# Patient Record
Sex: Female | Born: 1940 | Hispanic: No | Marital: Married | Smoking: Never smoker
Health system: Southern US, Community
[De-identification: ages and names within clinical notes are randomized; demographics above are authoritative.]

## PROBLEM LIST (undated history)

## (undated) DIAGNOSIS — E7800 Pure hypercholesterolemia, unspecified: Secondary | ICD-10-CM

## (undated) DIAGNOSIS — E119 Type 2 diabetes mellitus without complications: Secondary | ICD-10-CM

## (undated) DIAGNOSIS — I10 Essential (primary) hypertension: Secondary | ICD-10-CM

## (undated) HISTORY — PX: CHOLECYSTECTOMY: SHX55

---

## 2018-12-23 ENCOUNTER — Encounter (HOSPITAL_BASED_OUTPATIENT_CLINIC_OR_DEPARTMENT_OTHER): Payer: Self-pay

## 2018-12-23 ENCOUNTER — Emergency Department (HOSPITAL_BASED_OUTPATIENT_CLINIC_OR_DEPARTMENT_OTHER)
Admission: EM | Admit: 2018-12-23 | Discharge: 2018-12-23 | Disposition: A | Discharge status: Home / Self Care | Payer: Medicare Other | Attending: Emergency Medicine | Admitting: Emergency Medicine

## 2018-12-23 ENCOUNTER — Emergency Department (HOSPITAL_BASED_OUTPATIENT_CLINIC_OR_DEPARTMENT_OTHER): Payer: Medicare Other

## 2018-12-23 ENCOUNTER — Other Ambulatory Visit: Payer: Self-pay

## 2018-12-23 DIAGNOSIS — J40 Bronchitis, not specified as acute or chronic: Secondary | ICD-10-CM | POA: Diagnosis not present

## 2018-12-23 DIAGNOSIS — R05 Cough: Secondary | ICD-10-CM | POA: Diagnosis present

## 2018-12-23 DIAGNOSIS — E119 Type 2 diabetes mellitus without complications: Secondary | ICD-10-CM | POA: Diagnosis not present

## 2018-12-23 DIAGNOSIS — I10 Essential (primary) hypertension: Secondary | ICD-10-CM | POA: Insufficient documentation

## 2018-12-23 DIAGNOSIS — G510 Bell's palsy: Secondary | ICD-10-CM | POA: Insufficient documentation

## 2018-12-23 HISTORY — DX: Essential (primary) hypertension: I10

## 2018-12-23 HISTORY — DX: Type 2 diabetes mellitus without complications: E11.9

## 2018-12-23 HISTORY — DX: Pure hypercholesterolemia, unspecified: E78.00

## 2018-12-23 LAB — CBC WITH DIFFERENTIAL/PLATELET
Abs Immature Granulocytes: 0.02 10*3/uL (ref 0.00–0.07)
Basophils Absolute: 0 10*3/uL (ref 0.0–0.1)
Basophils Relative: 0 %
Eosinophils Absolute: 0.2 10*3/uL (ref 0.0–0.5)
Eosinophils Relative: 2 %
HCT: 41.7 % (ref 36.0–46.0)
Hemoglobin: 13.6 g/dL (ref 12.0–15.0)
Immature Granulocytes: 0 %
Lymphocytes Relative: 33 %
Lymphs Abs: 2.5 10*3/uL (ref 0.7–4.0)
MCH: 29.7 pg (ref 26.0–34.0)
MCHC: 32.6 g/dL (ref 30.0–36.0)
MCV: 91 fL (ref 80.0–100.0)
Monocytes Absolute: 0.6 10*3/uL (ref 0.1–1.0)
Monocytes Relative: 7 %
Neutro Abs: 4.5 10*3/uL (ref 1.7–7.7)
Neutrophils Relative %: 58 %
Platelets: 239 10*3/uL (ref 150–400)
RBC: 4.58 MIL/uL (ref 3.87–5.11)
RDW: 12.8 % (ref 11.5–15.5)
WBC: 7.8 10*3/uL (ref 4.0–10.5)
nRBC: 0 % (ref 0.0–0.2)

## 2018-12-23 LAB — URINALYSIS, ROUTINE W REFLEX MICROSCOPIC
Bilirubin Urine: NEGATIVE
GLUCOSE, UA: 100 mg/dL — AB
HGB URINE DIPSTICK: NEGATIVE
Ketones, ur: NEGATIVE mg/dL
Leukocytes, UA: NEGATIVE
Nitrite: NEGATIVE
Protein, ur: NEGATIVE mg/dL
Specific Gravity, Urine: <1.005 — ABNORMAL LOW (ref 1.005–1.030)
pH: 6.5 (ref 5.0–8.0)

## 2018-12-23 LAB — COMPREHENSIVE METABOLIC PANEL
ALT: 35 U/L (ref 0–44)
AST: 25 U/L (ref 15–41)
Albumin: 4.4 g/dL (ref 3.5–5.0)
Alkaline Phosphatase: 65 U/L (ref 38–126)
Anion gap: 7 (ref 5–15)
BUN: 17 mg/dL (ref 8–23)
CO2: 28 mmol/L (ref 22–32)
Calcium: 9.3 mg/dL (ref 8.9–10.3)
Chloride: 103 mmol/L (ref 98–111)
Creatinine, Ser: 0.88 mg/dL (ref 0.44–1.00)
GFR calc non Af Amer: >60 mL/min (ref >60)
Glucose, Bld: 194 mg/dL — ABNORMAL HIGH (ref 70–99)
Potassium: 3.7 mmol/L (ref 3.5–5.1)
Sodium: 138 mmol/L (ref 135–145)
TOTAL PROTEIN: 7.3 g/dL (ref 6.5–8.1)
Total Bilirubin: 1.1 mg/dL (ref 0.3–1.2)

## 2018-12-23 LAB — TROPONIN I: Troponin I: <0.03 ng/mL (ref <0.03)

## 2018-12-23 MED ORDER — VALACYCLOVIR HCL 1 G PO TABS: 1000.0000 mg | ORAL_TABLET | Freq: Three times a day (TID) | ORAL | 0 refills | Status: DC

## 2018-12-23 MED ORDER — IPRATROPIUM-ALBUTEROL 0.5-2.5 (3) MG/3ML IN SOLN
3.0000 mL | Freq: Once | RESPIRATORY_TRACT | Status: AC
  Administered 2018-12-23: 3 mL via RESPIRATORY_TRACT
  Filled 2018-12-23: qty 3

## 2018-12-23 MED ORDER — HYPROMELLOSE (GONIOSCOPIC) 2.5 % OP SOLN: 1.0000 [drp] | Freq: Three times a day (TID) | OPHTHALMIC | 12 refills | Status: AC | PRN

## 2018-12-23 MED ORDER — PREDNISONE 50 MG PO TABS
60.0000 mg | ORAL_TABLET | Freq: Once | ORAL | Status: AC
  Administered 2018-12-23: 60 mg via ORAL
  Filled 2018-12-23: qty 1

## 2018-12-23 MED ORDER — SODIUM CHLORIDE 0.9 % IV BOLUS
1000.0000 mL | Freq: Once | INTRAVENOUS | Status: AC
  Administered 2018-12-23: 1000 mL via INTRAVENOUS

## 2018-12-23 MED ORDER — ALBUTEROL SULFATE HFA 108 (90 BASE) MCG/ACT IN AERS
1.0000 | INHALATION_SPRAY | RESPIRATORY_TRACT | Status: DC | PRN
  Administered 2018-12-23: 2 via RESPIRATORY_TRACT
  Filled 2018-12-23: qty 6.7

## 2018-12-23 MED ORDER — MORPHINE SULFATE (PF) 4 MG/ML IV SOLN
4.0000 mg | Freq: Once | INTRAVENOUS | Status: AC
  Administered 2018-12-23: 4 mg via INTRAVENOUS
  Filled 2018-12-23: qty 1

## 2018-12-23 MED ORDER — AEROCHAMBER PLUS FLO-VU MEDIUM MISC
1.0000 | Freq: Once | Status: DC
  Filled 2018-12-23: qty 1

## 2018-12-23 MED ORDER — PREDNISONE 20 MG PO TABS: 60.0000 mg | ORAL_TABLET | Freq: Every day | ORAL | 0 refills | Status: DC

## 2018-12-23 MED ORDER — ONDANSETRON HCL 4 MG/2ML IJ SOLN
4.0000 mg | Freq: Once | INTRAMUSCULAR | Status: AC
  Administered 2018-12-23: 4 mg via INTRAVENOUS
  Filled 2018-12-23: qty 2

## 2018-12-23 MED ORDER — DOXYCYCLINE HYCLATE 100 MG PO CAPS: 100.0000 mg | ORAL_CAPSULE | Freq: Two times a day (BID) | ORAL | 0 refills | Status: DC

## 2018-12-23 NOTE — ED Provider Notes (Signed)
[facility] EMERGENCY DEPARTMENT Provider Note   CSN: [id number] Arrival date & time: 12/23/18  1503     History   Chief Complaint Chief Complaint  Patient presents with  . Cough  . Headache    HPI Tabitha Esparza is a 78 y.o. female.  Pt presents to the ED today with cough and headache.  She speaks only [place] and son is interpreting.  She said sx have been going on for the past few days.  She has taken otc cough meds, but they have not helped.  She has not been around anyone who has been sick.  Pt also has weakness to her right face and pain in her right ear.  This weakness to the face has been going on for several days.  No weakness to her arms or legs.     Past Medical History:  Diagnosis Date  . Diabetes mellitus without complication (HCC)   . High cholesterol   . Hypertension     There are no active problems to display for this patient.   Past Surgical History:  Procedure Laterality Date  . CHOLECYSTECTOMY       OB History   No obstetric history on file.      Home Medications    Prior to Admission medications   Medication Sig Start Date End Date Taking? Authorizing Provider  doxycycline (VIBRAMYCIN) 100 MG capsule Take 1 capsule (100 mg total) by mouth 2 (two) times daily. 12/23/18   Jacalyn Lefevre, MD  hydroxypropyl methylcellulose / hypromellose (ISOPTO TEARS / GONIOVISC) 2.5 % ophthalmic solution Place 1 drop into the right eye 3 (three) times daily as needed for dry eyes. 12/23/18   Jacalyn Lefevre, MD  predniSONE (DELTASONE) 20 MG tablet Take 3 tablets (60 mg total) by mouth daily. 12/23/18   Jacalyn Lefevre, MD  valACYclovir (VALTREX) 1000 MG tablet Take 1 tablet (1,000 mg total) by mouth 3 (three) times daily. 12/23/18   Jacalyn Lefevre, MD    Family History No family history on file.  Social History Social History   Tobacco Use  . Smoking status: Never Smoker  . Smokeless tobacco: Never Used  Substance Use Topics  . Alcohol use: Never    Frequency: Never  . Drug use: Never     Allergies   Carrot [daucus carota]   Review of Systems Review of Systems  Respiratory: Positive for cough.   Neurological: Positive for headaches.  All other systems reviewed and are negative.    Physical Exam Updated Vital Signs BP (!) 147/91 (BP Location: Right Arm)   Pulse 87   Temp 98 F (36.7 C) (Oral)   Resp 18   Ht 5\' 2"  (1.575 m)   Wt 58.1 kg   SpO2 99%   BMI 23.41 kg/m   Physical Exam Vitals signs and nursing note reviewed.  Constitutional:      Appearance: She is well-developed and normal weight.  HENT:     Head: Normocephalic and atraumatic.     Comments: Eyelid unable to completely shut on right.    Mouth/Throat:     Mouth: Mucous membranes are moist.     Pharynx: Oropharynx is clear.  Eyes:     Extraocular Movements: Extraocular movements intact.     Pupils: Pupils are equal, round, and reactive to light.  Neck:     Musculoskeletal: Normal range of motion and neck supple.  Cardiovascular:     Rate and Rhythm: Normal rate and regular rhythm.  Pulmonary:  Effort: Pulmonary effort is normal.     Breath sounds: Normal breath sounds.  Abdominal:     General: Bowel sounds are normal.     Palpations: Abdomen is soft.  Musculoskeletal: Normal range of motion.  Skin:    General: Skin is warm.     Capillary Refill: Capillary refill takes less [place] 2 seconds.  Neurological:     Mental Status: She is alert.     Cranial Nerves: Facial asymmetry present.  Psychiatric:        Mood and Affect: Mood normal.        Behavior: Behavior normal.      ED Treatments / Results  Labs (all labs ordered are listed, but only abnormal results are displayed) Labs Reviewed  COMPREHENSIVE METABOLIC PANEL - Abnormal; Notable for the following components:      Result Value   Glucose, Bld 194 (*)    All other components within normal limits  URINALYSIS, ROUTINE W REFLEX MICROSCOPIC - Abnormal; Notable for the following  components:   Specific Gravity, Urine <1.005 (*)    Glucose, UA 100 (*)    All other components within normal limits  CBC WITH DIFFERENTIAL/PLATELET  TROPONIN I    EKG None  Radiology Dg Chest 2 View  Result Date: 12/23/2018 CLINICAL DATA:  Cough and shortness of breath for the past 3 days. EXAM: CHEST - 2 VIEW COMPARISON:  None. FINDINGS: The heart size and mediastinal contours are within normal limits. Normal pulmonary vascularity. No focal consolidation, pleural effusion, or pneumothorax. No acute osseous abnormality. Prior cholecystectomy. IMPRESSION: No active cardiopulmonary disease. Electronically Signed   By: Obie DredgeWilliam T Derry M.D.   On: 12/23/2018 16:22   Ct Head Wo Contrast  Result Date: 12/23/2018 CLINICAL DATA:  Cough, headache and right ear pain. Right facial swelling for 3 days. EXAM: CT HEAD WITHOUT CONTRAST TECHNIQUE: Contiguous axial images were obtained from the base of the skull through the vertex without intravenous contrast. COMPARISON:  None. FINDINGS: Brain: No evidence of acute infarction, hemorrhage, hydrocephalus, extra-axial collection or mass lesion/mass effect. Prominence of the sulci and ventricles identified compatible with age related brain atrophy. 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 There is mild diffuse low-attenuation within the subcortical and periventricular white matter compatible with chronic microvascular disease. Vascular: No hyperdense vessel or unexpected calcification. Skull: Normal. Negative for fracture or focal lesion. Sinuses/Orbits: The mastoid air cells and paranasal sinuses are clear. Other: None. IMPRESSION: 1. No acute intracranial abnormalities. 2. Chronic small vessel ischemic change and brain atrophy. Electronically Signed   By: Signa Kellaylor  Stroud M.D.   On: 12/23/2018 16:30    Procedures Procedures (including critical care time)  Medications Ordered in ED Medications  albuterol (PROVENTIL HFA;VENTOLIN HFA)  108 (90 Base) MCG/ACT inhaler 1-2 puff (has no administration in time range)  AEROCHAMBER PLUS FLO-VU MEDIUM MISC 1 each (has no administration in time range)  predniSONE (DELTASONE) tablet 60 mg (has no administration in time range)  ipratropium-albuterol (DUONEB) 0.5-2.5 (3) MG/3ML nebulizer solution 3 mL (3 mLs Nebulization Given 12/23/18 1628)  sodium chloride 0.9 % bolus 1,000 mL (1,000 mLs Intravenous New Bag/Given 12/23/18 1637)  ondansetron (ZOFRAN) injection 4 mg (4 mg Intravenous Given 12/23/18 1637)  morphine 4 MG/ML injection 4 mg (4 mg Intravenous Given 12/23/18 1638)     Initial Impression / Assessment and Plan / ED Course  I have reviewed the triage vital signs and the nursing notes.  Pertinent labs & imaging results that were available during  my care of the patient were reviewed by me and considered in my medical decision making (see chart for details).    Pt looks like she has Bells palsy on the right.  She also has bronchitis, so will be treated for both.  She does not have an obvious lesion in her ear, but does have ear pain and will be treated with valtrex.  She knows to return if worse.  F/u with pcp.  Final Clinical Impressions(s) / ED Diagnoses   Final diagnoses:  Bell's palsy  Bronchitis    ED Discharge Orders         Ordered    valACYclovir (VALTREX) 1000 MG tablet  3 times daily     12/23/18 1754    predniSONE (DELTASONE) 20 MG tablet  Daily     12/23/18 1754    doxycycline (VIBRAMYCIN) 100 MG capsule  2 times daily     12/23/18 1754    hydroxypropyl methylcellulose / hypromellose (ISOPTO TEARS / GONIOVISC) 2.5 % ophthalmic solution  3 times daily PRN     12/23/18 1754           Jacalyn Lefevre, MD 12/23/18 1756

## 2018-12-23 NOTE — ED Notes (Signed)
C/o productive cough (clear), ha, rt ear pain, rt facial swelling x 3 days

## 2018-12-23 NOTE — ED Triage Notes (Addendum)
C/o prod cough, HA x 3 days-NAD-to triage in w/c-pt's son inerpreting

## 2020-06-08 ENCOUNTER — Emergency Department (HOSPITAL_BASED_OUTPATIENT_CLINIC_OR_DEPARTMENT_OTHER)
Admission: EM | Admit: 2020-06-08 | Discharge: 2020-06-08 | Disposition: A | Discharge status: Home / Self Care | Payer: Medicare Other | Attending: Emergency Medicine | Admitting: Emergency Medicine

## 2020-06-08 ENCOUNTER — Encounter (HOSPITAL_BASED_OUTPATIENT_CLINIC_OR_DEPARTMENT_OTHER): Payer: Self-pay | Admitting: Emergency Medicine

## 2020-06-08 ENCOUNTER — Emergency Department (HOSPITAL_BASED_OUTPATIENT_CLINIC_OR_DEPARTMENT_OTHER): Payer: Medicare Other

## 2020-06-08 ENCOUNTER — Other Ambulatory Visit: Payer: Self-pay

## 2020-06-08 DIAGNOSIS — E119 Type 2 diabetes mellitus without complications: Secondary | ICD-10-CM | POA: Diagnosis not present

## 2020-06-08 DIAGNOSIS — R519 Headache, unspecified: Secondary | ICD-10-CM | POA: Diagnosis present

## 2020-06-08 DIAGNOSIS — Z7984 Long term (current) use of oral hypoglycemic drugs: Secondary | ICD-10-CM | POA: Insufficient documentation

## 2020-06-08 DIAGNOSIS — R0602 Shortness of breath: Secondary | ICD-10-CM | POA: Diagnosis not present

## 2020-06-08 DIAGNOSIS — G44209 Tension-type headache, unspecified, not intractable: Secondary | ICD-10-CM | POA: Diagnosis not present

## 2020-06-08 LAB — CBC WITH DIFFERENTIAL/PLATELET
Abs Immature Granulocytes: 0.02 10*3/uL (ref 0.00–0.07)
Basophils Absolute: 0.1 10*3/uL (ref 0.0–0.1)
Basophils Relative: 1 %
Eosinophils Absolute: 0.3 10*3/uL (ref 0.0–0.5)
Eosinophils Relative: 4 %
HCT: 40.1 % (ref 36.0–46.0)
Hemoglobin: 13.4 g/dL (ref 12.0–15.0)
Immature Granulocytes: 0 %
Lymphocytes Relative: 31 %
Lymphs Abs: 2.5 10*3/uL (ref 0.7–4.0)
MCH: 29.8 pg (ref 26.0–34.0)
MCHC: 33.4 g/dL (ref 30.0–36.0)
MCV: 89.1 fL (ref 80.0–100.0)
Monocytes Absolute: 0.5 10*3/uL (ref 0.1–1.0)
Monocytes Relative: 6 %
Neutro Abs: 4.6 10*3/uL (ref 1.7–7.7)
Neutrophils Relative %: 58 %
Platelets: 245 10*3/uL (ref 150–400)
RBC: 4.5 MIL/uL (ref 3.87–5.11)
RDW: 13.1 % (ref 11.5–15.5)
WBC: 8 10*3/uL (ref 4.0–10.5)
nRBC: 0 % (ref 0.0–0.2)

## 2020-06-08 LAB — COMPREHENSIVE METABOLIC PANEL
ALT: 35 U/L (ref 0–44)
AST: 25 U/L (ref 15–41)
Albumin: 4.1 g/dL (ref 3.5–5.0)
Alkaline Phosphatase: 67 U/L (ref 38–126)
Anion gap: 8 (ref 5–15)
BUN: 19 mg/dL (ref 8–23)
CO2: 27 mmol/L (ref 22–32)
Calcium: 9.3 mg/dL (ref 8.9–10.3)
Chloride: 100 mmol/L (ref 98–111)
Creatinine, Ser: 1.06 mg/dL — ABNORMAL HIGH (ref 0.44–1.00)
GFR calc Af Amer: 59 mL/min — ABNORMAL LOW (ref >60)
GFR calc non Af Amer: 51 mL/min — ABNORMAL LOW (ref >60)
Glucose, Bld: 108 mg/dL — ABNORMAL HIGH (ref 70–99)
Potassium: 4.6 mmol/L (ref 3.5–5.1)
Sodium: 135 mmol/L (ref 135–145)
Total Bilirubin: 0.9 mg/dL (ref 0.3–1.2)
Total Protein: 7.4 g/dL (ref 6.5–8.1)

## 2020-06-08 LAB — TROPONIN I (HIGH SENSITIVITY): Troponin I (High Sensitivity): 3 ng/L (ref <18)

## 2020-06-08 NOTE — ED Notes (Signed)
Spoke with pt's son, Odette Horns, and he will pick her up.

## 2020-06-08 NOTE — Discharge Instructions (Signed)
Please schedule follow-up appointment with primary doctor regarding the episode that you are having today.  If you have worsening headaches, neck pain, chest pain, difficulty breathing or other new concerning symptom, recommend return to ER for reassessment.

## 2020-06-08 NOTE — ED Triage Notes (Signed)
Per daughter, pt c/p HA and feeling weak since last night; pt c/o SHOB, no distress noted in triage

## 2020-06-08 NOTE — ED Provider Notes (Signed)
[facility] EMERGENCY DEPARTMENT Provider Note   CSN: [id number] Arrival date & time: 06/08/20  1012     History Chief Complaint  Patient presents with   Headache   Weakness    Tabitha Esparza is a 79 y.o. female.  Accompanied by daughter, patient is via me speaking only.  Additional history obtained via daughter who speaks [place].  Utilized Electronics engineer with patient.  Patient presented to ER with complaints of generalized weakness, headache and shortness of breath.  Symptoms started sometime last night.  Had an episode where she felt like she could not catch her breath.  The breathing issues seem to resolve spontaneously she does not have any ongoing shortness of breath.  There was no associated chest pain.  Also is having dull frontal headache, mild, not worse headache of her life, no associated neck pain, neck stiffness, no fevers.  PMH DM, HL, HTN, nonsmoker.   HPI     Past Medical History:  Diagnosis Date   Diabetes mellitus without complication (HCC)    High cholesterol    Hypertension     There are no problems to display for this patient.   Past Surgical History:  Procedure Laterality Date   CHOLECYSTECTOMY       OB History   No obstetric history on file.     No family history on file.  Social History   Tobacco Use   Smoking status: Never Smoker   Smokeless tobacco: Never Used  Substance Use Topics   Alcohol use: Never   Drug use: Never    Home Medications Prior to Admission medications   Medication Sig Start Date End Date Taking? Authorizing Provider  doxycycline (VIBRAMYCIN) 100 MG capsule Take 1 capsule (100 mg total) by mouth 2 (two) times daily. 12/23/18   Jacalyn Lefevre, MD  hydroxypropyl methylcellulose / hypromellose (ISOPTO TEARS / GONIOVISC) 2.5 % ophthalmic solution Place 1 drop into the right eye 3 (three) times daily as needed for dry eyes. 12/23/18   Jacalyn Lefevre, MD  predniSONE (DELTASONE) 20 MG tablet  Take 3 tablets (60 mg total) by mouth daily. 12/23/18   Jacalyn Lefevre, MD  valACYclovir (VALTREX) 1000 MG tablet Take 1 tablet (1,000 mg total) by mouth 3 (three) times daily. 12/23/18   Jacalyn Lefevre, MD    Allergies    Carrot [daucus carota]  Review of Systems   Review of Systems  Constitutional: Negative for chills and fever.  HENT: Negative for ear pain and sore throat.   Eyes: Negative for pain and visual disturbance.  Respiratory: Positive for shortness of breath. Negative for cough.   Cardiovascular: Positive for palpitations. Negative for chest pain.  Gastrointestinal: Negative for abdominal pain and vomiting.  Genitourinary: Negative for dysuria and hematuria.  Musculoskeletal: Negative for arthralgias and back pain.  Skin: Negative for color change and rash.  Neurological: Positive for headaches. Negative for seizures and syncope.  All other systems reviewed and are negative.   Physical Exam Updated Vital Signs BP (!) 141/84    Pulse 70    Temp 97.6 F (36.4 C) (Oral)    Resp 16    Ht 5\' 4"  (1.626 m)    Wt 59.6 kg    SpO2 99%    BMI 22.57 kg/m   Physical Exam  ED Results / Procedures / Treatments   Labs (all labs ordered are listed, but only abnormal results are displayed) Labs Reviewed  COMPREHENSIVE METABOLIC PANEL - Abnormal; Notable for the following components:  Result Value   Glucose, Bld 108 (*)    Creatinine, Ser 1.06 (*)    GFR calc non Af Amer 51 (*)    GFR calc Af Amer 59 (*)    All other components within normal limits  CBC WITH DIFFERENTIAL/PLATELET  TROPONIN I (HIGH SENSITIVITY)    EKG EKG Interpretation  Date/Time:  Tuesday June 08 2020 14:07:13 EDT Ventricular Rate:  66 PR Interval:    QRS Duration: 86 QT Interval:  427 QTC Calculation: 448 R Axis:   71 Text Interpretation: Normal sinus rhythm LOW VOLTAGE Non-specific ST-t changes No old tracing to compare Confirmed by Virgel Manifold [phone]) on 06/08/2020 3:32:13  PM   Radiology DG Chest 2 View  Result Date: 06/08/2020 CLINICAL DATA:  Chest pain.  Shortness of breath. EXAM: CHEST - 2 VIEW COMPARISON:  12/23/2018. FINDINGS: Mediastinum and hilar structures normal. Lungs are clear. No pleural effusion or pneumothorax. Heart size normal. Thoracolumbar spine scoliosis. Surgical clips right upper quadrant. IMPRESSION: No acute cardiopulmonary disease. Electronically Signed   By: Marcello Moores  Register   On: 06/08/2020 14:13   CT Head Wo Contrast  Result Date: 06/08/2020 CLINICAL DATA:  Headache, acute, normal neuro exam. Additional history provided: Headache, weakness, fatigue since yesterday. EXAM: CT HEAD WITHOUT CONTRAST TECHNIQUE: Contiguous axial images were obtained from the base of the skull through the vertex without intravenous contrast. COMPARISON:  Prior head CT examinations 12/23/2018 and earlier, brain MRI 05/11/2017 FINDINGS: Brain: Stable, mild generalized parenchymal atrophy. Mild ill-defined hypoattenuation within the cerebral white matter is nonspecific, but consistent with chronic small vessel ischemic disease. These findings have also not significantly changed as compared to the prior CT of 12/23/2018. There is no acute intracranial hemorrhage. No demarcated cortical infarct is identified. No extra-axial fluid collection. No evidence of intracranial mass. No midline shift. Partially empty sella turcica. Vascular: No hyperdense vessel.  Atherosclerotic calcifications. Skull: Normal. Negative for fracture or focal lesion. Sinuses/Orbits: Visualized orbits show no acute finding. No significant paranasal sinus disease or mastoid effusion at the imaged levels. Chronic medially displaced fracture deformity of the left lamina papyracea. IMPRESSION: No CT evidence of acute intracranial abnormality. Stable, mild generalized parenchymal atrophy and chronic small vessel ischemic disease. Electronically Signed   By: Kellie Simmering DO   On: 06/08/2020 14:31     Procedures Procedures (including critical care time)  Medications Ordered in ED Medications - No data to display  ED Course  I have reviewed the triage vital signs and the nursing notes.  Pertinent labs & imaging results that were available during my care of the patient were reviewed by me and considered in my medical decision making (see chart for details).    MDM Rules/Calculators/A&P                          79 year old lady presented to ER with headache, episode of shortness of breath and generalized fatigue.  On exam, patient is well-appearing, her symptoms have mostly resolved spontaneously.  EKG without acute ischemic change, troponin within normal limits, doubt ACS.  CXR negative.  No hypoxia, no tachypnea or tachycardia, doubt PE.  Regarding headache, CT head was negative for acute pathology, she has no neurologic complaints and is well-appearing.  Doubt acute intracranial pathology given this assessment work-up.  On reassessment, patient remains well-appearing, she has no ongoing symptoms.  Given the work-up that was completed today, believe patient can be discharged home and managed in the outpatient setting.  I  recommended that she follow-up with her primary doctor, recommend return to ER for recurrent or worsening symptoms.  Daughter at bedside updated throughout patient's stay and in agreement with plan.    After the discussed management above, the patient was determined to be safe for discharge.  The patient was in agreement with this plan and all questions regarding their care were answered.  ED return precautions were discussed and the patient will return to the ED with any significant worsening of condition.   Final Clinical Impression(s) / ED Diagnoses Final diagnoses:  Shortness of breath  Tension-type headache, not intractable, unspecified chronicity pattern    Rx / DC Orders ED Discharge Orders    None       Milagros Loll, MD 06/09/20 1047

## 2020-11-09 IMAGING — CT CT HEAD W/O CM
3 series · 15 of 47 positions shown, 18 images · non-contrast
Comparison: Prior head CT examinations 12/23/2018 and earlier,
brain MRI 05/11/2017

CLINICAL DATA: Headache, acute, normal neuro exam. Additional
history provided: Headache, weakness, fatigue since yesterday.

EXAM:
CT HEAD WITHOUT CONTRAST
TECHNIQUE: Contiguous axial images were obtained from the base of the skull
through the vertex without intravenous contrast.

[Series 2: head wo · axial · 0.41mm/px · z∈[-168,-38]mm · 9 of 32 slices shown, 12 images]
[im 3/32  brain]
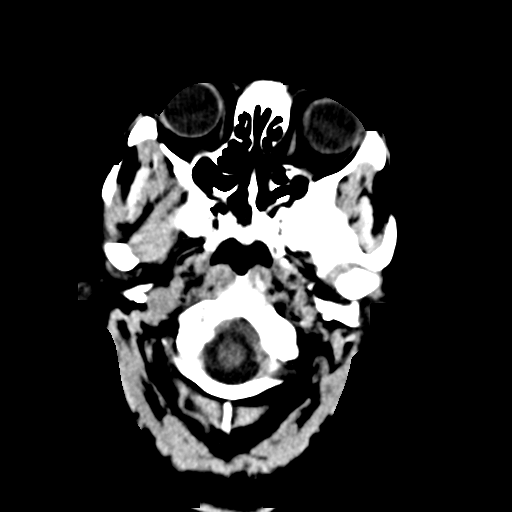
[im 3/32  bone]
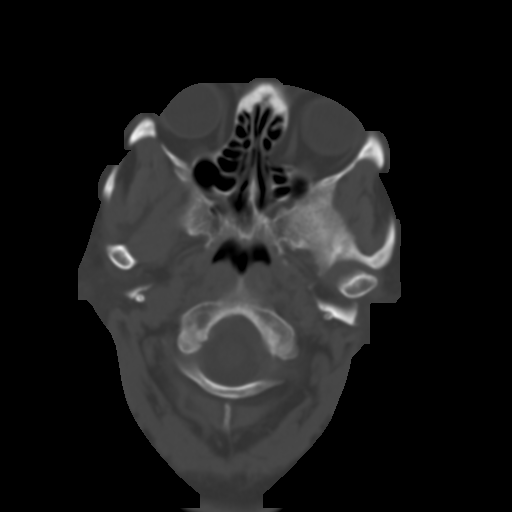
[im 6/32  brain]
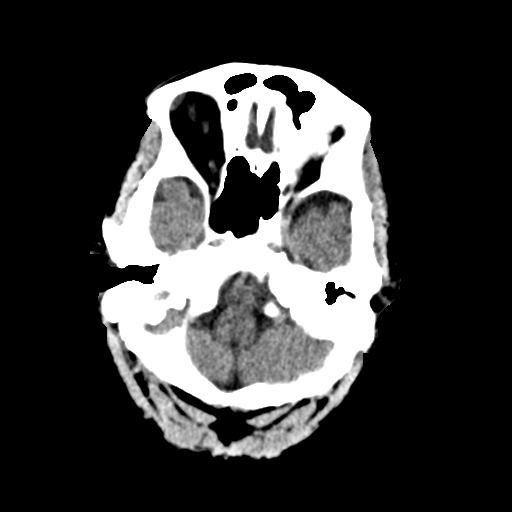
[im 9/32  brain]
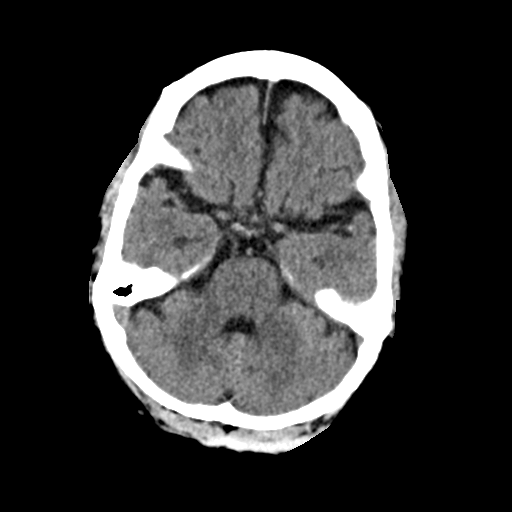
[im 12/32  brain]
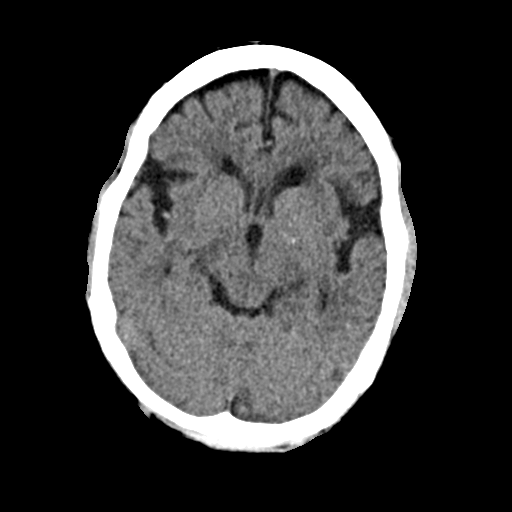
[im 17/32  brain]
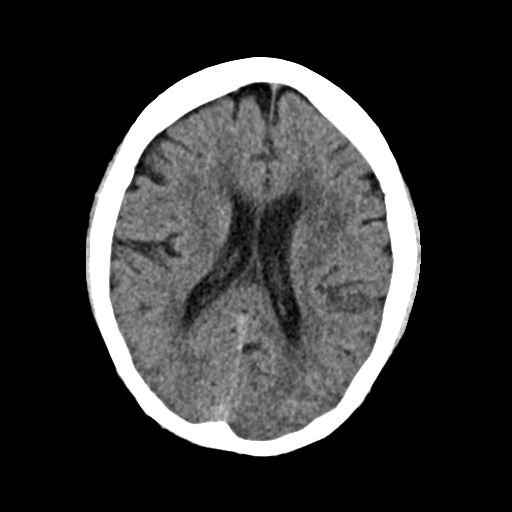
[im 17/32  bone]
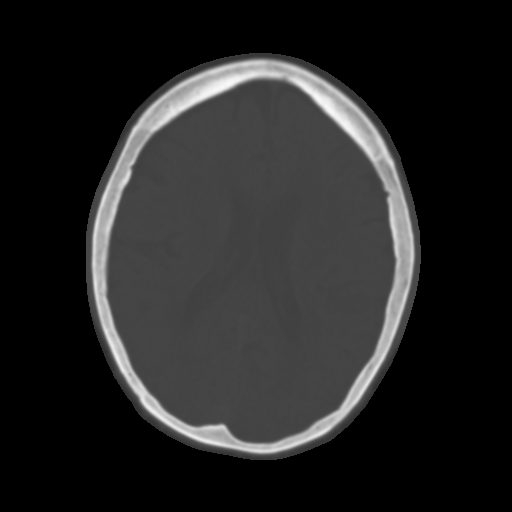
[im 20/32  brain]
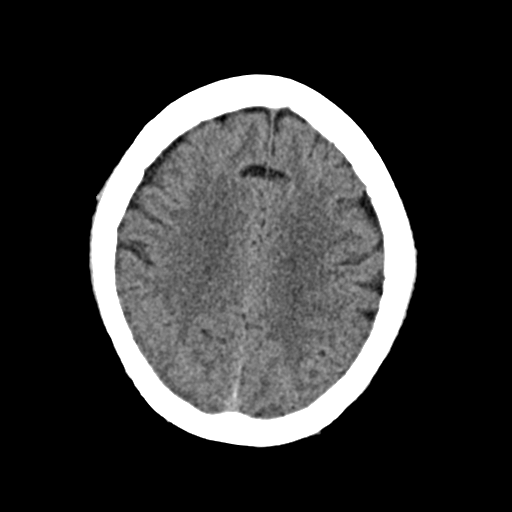
[im 23/32  brain]
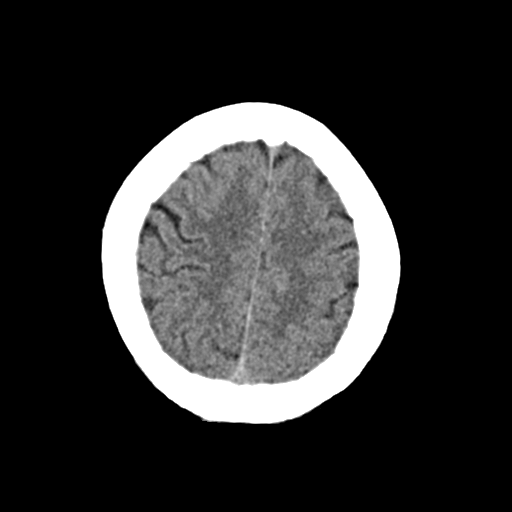
[im 26/32  brain]
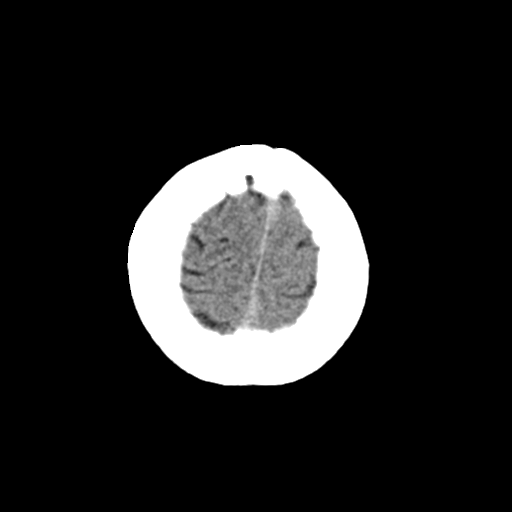
[im 29/32  brain]
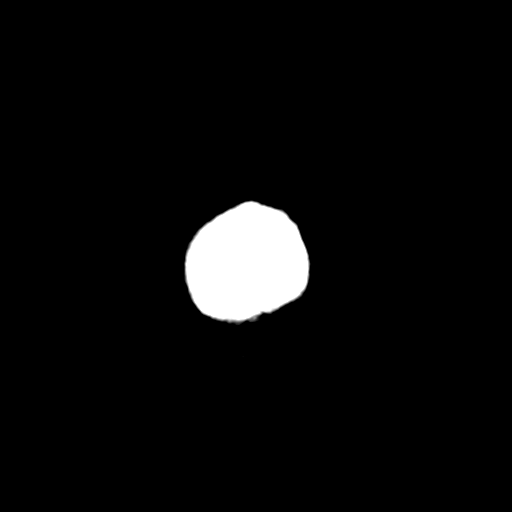
[im 29/32  bone]
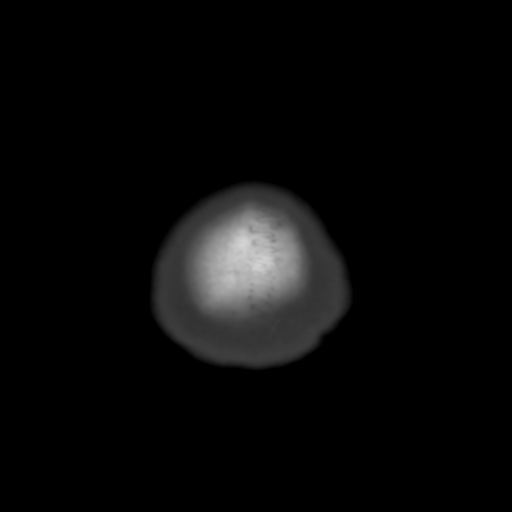

[Series 4: coronal soft · coronal · 0.33mm/px · 3 of 64 slices shown]
[im 22/64  brain]
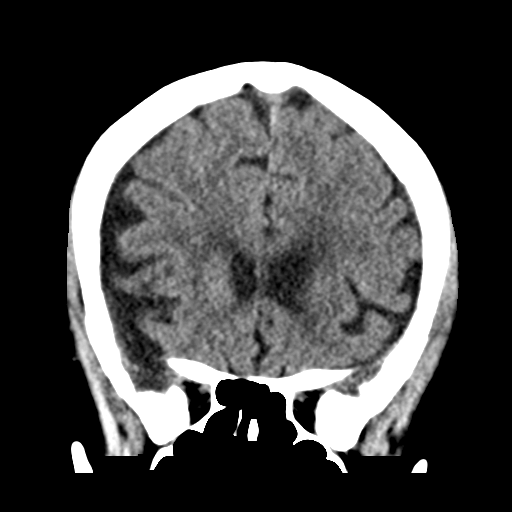
[im 29/64  brain]
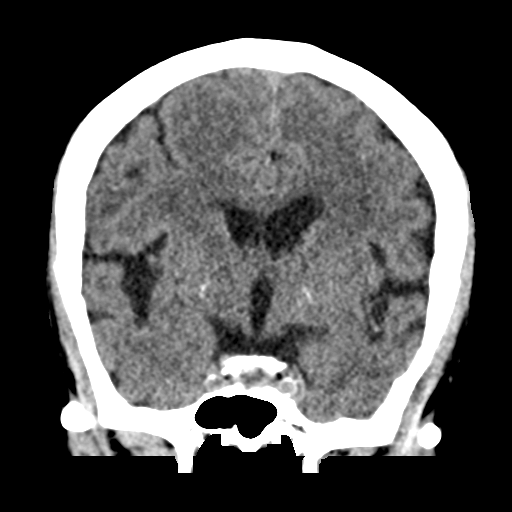
[im 36/64  brain]
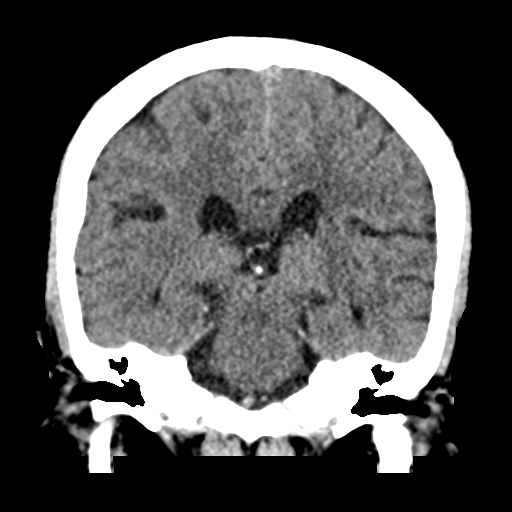

[Series 5: sag soft · sagittal · 0.30mm/px · 3 of 53 slices shown]
[im 18/53  brain]
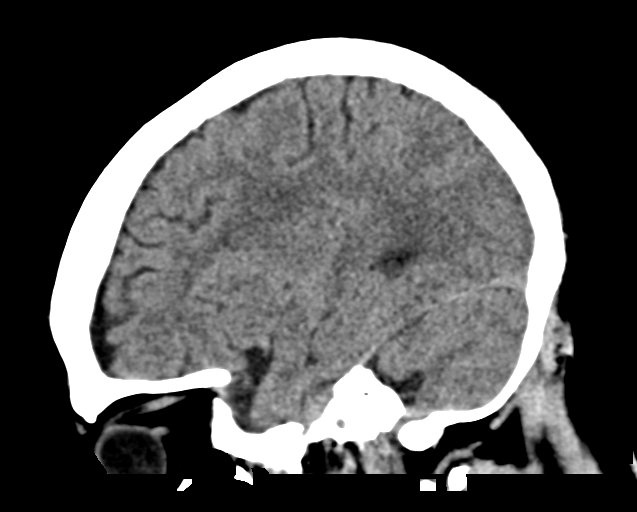
[im 27/53  brain]
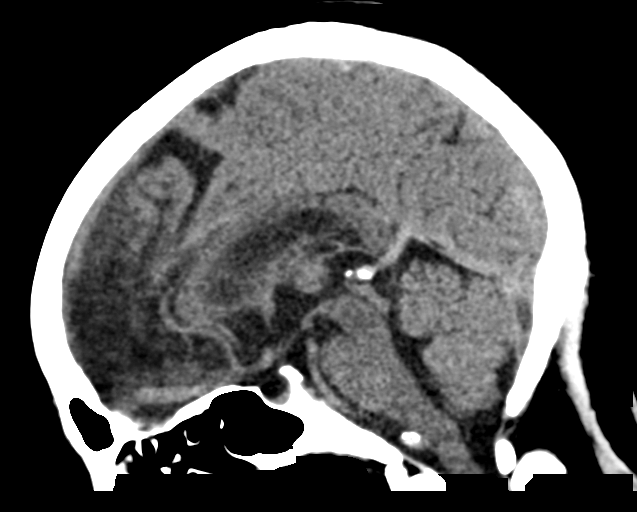
[im 35/53  brain]
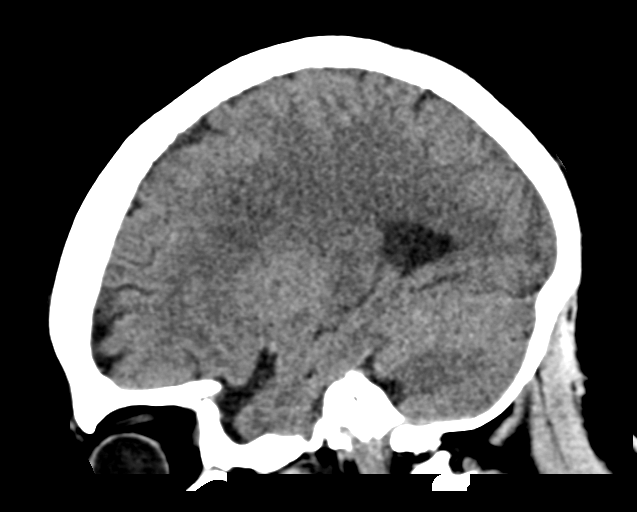

[15 of 47 positions shown; findings below may reference images not displayed]

FINDINGS: Brain:

Stable, mild generalized parenchymal atrophy.

Mild ill-defined hypoattenuation within the cerebral white matter is
nonspecific, but consistent with chronic small vessel ischemic
disease. These findings have also not significantly changed as
compared to the prior CT of 12/23/2018.

There is no acute intracranial hemorrhage.

No demarcated cortical infarct is identified.

No extra-axial fluid collection.

No evidence of intracranial mass.

No midline shift.

Partially empty sella turcica.

Vascular: No hyperdense vessel.  Atherosclerotic calcifications.

Skull: Normal. Negative for fracture or focal lesion.

Sinuses/Orbits: Visualized orbits show no acute finding. No
significant paranasal sinus disease or mastoid effusion at the
imaged levels. Chronic medially displaced fracture deformity of the
left lamina papyracea.
IMPRESSION: No CT evidence of acute intracranial abnormality.

Stable, mild generalized parenchymal atrophy and chronic small
vessel ischemic disease.

## 2022-01-24 ENCOUNTER — Other Ambulatory Visit: Payer: Self-pay

## 2022-01-24 ENCOUNTER — Encounter (HOSPITAL_BASED_OUTPATIENT_CLINIC_OR_DEPARTMENT_OTHER): Payer: Self-pay | Admitting: Urology

## 2022-01-24 ENCOUNTER — Emergency Department (HOSPITAL_BASED_OUTPATIENT_CLINIC_OR_DEPARTMENT_OTHER)
Admission: EM | Admit: 2022-01-24 | Discharge: 2022-01-24 | Disposition: A | Discharge status: Home / Self Care | Payer: Medicare Other | Attending: Emergency Medicine | Admitting: Emergency Medicine

## 2022-01-24 ENCOUNTER — Emergency Department (HOSPITAL_BASED_OUTPATIENT_CLINIC_OR_DEPARTMENT_OTHER): Payer: Medicare Other

## 2022-01-24 DIAGNOSIS — E1165 Type 2 diabetes mellitus with hyperglycemia: Secondary | ICD-10-CM | POA: Insufficient documentation

## 2022-01-24 DIAGNOSIS — E139 Other specified diabetes mellitus without complications: Secondary | ICD-10-CM

## 2022-01-24 DIAGNOSIS — Z79899 Other long term (current) drug therapy: Secondary | ICD-10-CM | POA: Diagnosis not present

## 2022-01-24 DIAGNOSIS — R739 Hyperglycemia, unspecified: Secondary | ICD-10-CM

## 2022-01-24 DIAGNOSIS — Z20822 Contact with and (suspected) exposure to covid-19: Secondary | ICD-10-CM | POA: Insufficient documentation

## 2022-01-24 DIAGNOSIS — J069 Acute upper respiratory infection, unspecified: Secondary | ICD-10-CM | POA: Diagnosis not present

## 2022-01-24 DIAGNOSIS — R059 Cough, unspecified: Secondary | ICD-10-CM | POA: Diagnosis present

## 2022-01-24 LAB — CBC WITH DIFFERENTIAL/PLATELET
Abs Immature Granulocytes: 0.02 10*3/uL (ref 0.00–0.07)
Basophils Absolute: 0 10*3/uL (ref 0.0–0.1)
Basophils Relative: 0 %
Eosinophils Absolute: 0.2 10*3/uL (ref 0.0–0.5)
Eosinophils Relative: 2 %
HCT: 41.3 % (ref 36.0–46.0)
Hemoglobin: 14.3 g/dL (ref 12.0–15.0)
Immature Granulocytes: 0 %
Lymphocytes Relative: 31 %
Lymphs Abs: 2.3 10*3/uL (ref 0.7–4.0)
MCH: 30.7 pg (ref 26.0–34.0)
MCHC: 34.6 g/dL (ref 30.0–36.0)
MCV: 88.6 fL (ref 80.0–100.0)
Monocytes Absolute: 0.6 10*3/uL (ref 0.1–1.0)
Monocytes Relative: 8 %
Neutro Abs: 4.3 10*3/uL (ref 1.7–7.7)
Neutrophils Relative %: 59 %
Platelets: 241 10*3/uL (ref 150–400)
RBC: 4.66 MIL/uL (ref 3.87–5.11)
RDW: 12.6 % (ref 11.5–15.5)
WBC: 7.5 10*3/uL (ref 4.0–10.5)
nRBC: 0 % (ref 0.0–0.2)

## 2022-01-24 LAB — BASIC METABOLIC PANEL WITH GFR
Anion gap: 9 (ref 5–15)
BUN: 28 mg/dL — ABNORMAL HIGH (ref 8–23)
CO2: 24 mmol/L (ref 22–32)
Calcium: 9.1 mg/dL (ref 8.9–10.3)
Chloride: 103 mmol/L (ref 98–111)
Creatinine, Ser: 1.12 mg/dL — ABNORMAL HIGH (ref 0.44–1.00)
GFR, Estimated: 50 mL/min — ABNORMAL LOW
Glucose, Bld: 201 mg/dL — ABNORMAL HIGH (ref 70–99)
Potassium: 4 mmol/L (ref 3.5–5.1)
Sodium: 136 mmol/L (ref 135–145)

## 2022-01-24 LAB — RESP PANEL BY RT-PCR (FLU A&B, COVID) ARPGX2
Influenza A by PCR: NEGATIVE
Influenza B by PCR: NEGATIVE
SARS Coronavirus 2 by RT PCR: NEGATIVE

## 2022-01-24 LAB — CBG MONITORING, ED: Glucose-Capillary: 198 mg/dL — ABNORMAL HIGH (ref 70–99)

## 2022-01-24 LAB — TROPONIN I (HIGH SENSITIVITY): Troponin I (High Sensitivity): 3 ng/L (ref <18)

## 2022-01-24 MED ORDER — GUAIFENESIN-DM 100-10 MG/5ML PO SYRP
10.0000 mL | ORAL_SOLUTION | ORAL | 0 refills | Status: DC | PRN
Start: 2022-01-24 — End: 2022-05-20

## 2022-01-24 MED ORDER — OXYMETAZOLINE HCL 0.05 % NA SOLN: 1.0000 | Freq: Two times a day (BID) | NASAL | 0 refills | Status: AC

## 2022-01-24 NOTE — Discharge Instructions (Addendum)
Drink warm tea with honey for sore throat Gargle with warm salt water three times a day  Take Robitussin cough syrup as needed for cough  Afrin nose spray for nasal congestion  Motrin/tylenol every 6 hours as needed for body aches or fevers

## 2022-01-24 NOTE — ED Provider Notes (Signed)
[facility] EMERGENCY DEPARTMENT Provider Note   CSN: [id number] Arrival date & time: 01/24/22  0855     History  Chief Complaint  Patient presents with   Nasal Congestion   Hyperglycemia    Tabitha Esparza is a 81 y.o. female.  Pt is a 81 yo female with PMH of diabetes controlled on oral meds only presenting for hyperglycemia. Pt admits to elevated sugars with highest at 330 at home. Admits to cold like symptoms over the past five days including nasal congestions, sore throat, and cough. Pt admits to severe weakness making it difficult to ambulate.   The history is provided by the patient. No language interpreter was used.  Hyperglycemia Associated symptoms: weakness   Associated symptoms: no abdominal pain, no chest pain, no dysuria, no fever, no shortness of breath and no vomiting       Home Medications Prior to Admission medications   Medication Sig Start Date End Date Taking? Authorizing Provider  doxycycline (VIBRAMYCIN) 100 MG capsule Take 1 capsule (100 mg total) by mouth 2 (two) times daily. 12/23/18   Isla Pence, MD  hydroxypropyl methylcellulose / hypromellose (ISOPTO TEARS / GONIOVISC) 2.5 % ophthalmic solution Place 1 drop into the right eye 3 (three) times daily as needed for dry eyes. 12/23/18   Isla Pence, MD  predniSONE (DELTASONE) 20 MG tablet Take 3 tablets (60 mg total) by mouth daily. 12/23/18   Isla Pence, MD  valACYclovir (VALTREX) 1000 MG tablet Take 1 tablet (1,000 mg total) by mouth 3 (three) times daily. 12/23/18   Isla Pence, MD      Allergies    Carrot [daucus carota]    Review of Systems   Review of Systems  Constitutional:  Negative for chills and fever.  HENT:  Positive for congestion and sore throat. Negative for ear pain.   Eyes:  Negative for pain and visual disturbance.  Respiratory:  Negative for cough and shortness of breath.   Cardiovascular:  Negative for chest pain and palpitations.  Gastrointestinal:  Negative for  abdominal pain and vomiting.  Genitourinary:  Negative for dysuria and hematuria.  Musculoskeletal:  Negative for arthralgias and back pain.  Skin:  Negative for color change and rash.  Neurological:  Positive for weakness. Negative for seizures and syncope.  All other systems reviewed and are negative.  Physical Exam Updated Vital Signs BP 134/85 (BP Location: Right Arm)    Pulse 75    Temp 98.2 F (36.8 C) (Oral)    Resp 18    Ht 5\' 4"  (1.626 m)    Wt 59.6 kg    SpO2 98%    BMI 22.55 kg/m  Physical Exam Vitals and nursing note reviewed.  Constitutional:      General: She is not in acute distress.    Appearance: She is well-developed.  HENT:     Head: Normocephalic and atraumatic.  Eyes:     Conjunctiva/sclera: Conjunctivae normal.  Cardiovascular:     Rate and Rhythm: Normal rate and regular rhythm.     Heart sounds: No murmur heard. Pulmonary:     Effort: Pulmonary effort is normal. No respiratory distress.     Breath sounds: Normal breath sounds.  Abdominal:     Palpations: Abdomen is soft.     Tenderness: There is no abdominal tenderness.  Musculoskeletal:        General: No swelling.     Cervical back: Neck supple.  Skin:    General: Skin is warm and dry.  Capillary Refill: Capillary refill takes less [place] 2 seconds.  Neurological:     Mental Status: She is alert.  Psychiatric:        Mood and Affect: Mood normal.    ED Results / Procedures / Treatments   Labs (all labs ordered are listed, but only abnormal results are displayed) Labs Reviewed  CBG MONITORING, ED - Abnormal; Notable for the following components:      Result Value   Glucose-Capillary 198 (*)    All other components within normal limits  RESP PANEL BY RT-PCR (FLU A&B, COVID) ARPGX2  CBC WITH DIFFERENTIAL/PLATELET  BASIC METABOLIC PANEL  TROPONIN I (HIGH SENSITIVITY)    EKG None  Radiology No results found.  Procedures Procedures    Medications Ordered in ED Medications - No  data to display  ED Course/ Medical Decision Making/ A&P                           Medical Decision Making Amount and/or Complexity of Data Reviewed Labs: ordered. Radiology: ordered.  Risk OTC drugs.   9:47 AM 81 yo female with PMH of diabetes controlled on oral meds only presenting for hyperglycemia. Pt is Aox3, no acute distress, afebrile, with stable vitals. Non toxic appearing. No evidence of DKA. Hyperglycemia present. Not taking insulin do to not eating because of high sugars and fear that eating will make them higher. Pt encouraged to eat and take insulin as prescribed. Hypergylcemia likely secondary to current viral URI.   Complaints of generalized fatigue likely due to not eating. Stable ECG with no ST segment elevation or depression. Stable tropoonins.   Patient in no distress and overall condition improved here in the ED. Detailed discussions were had with the patient regarding current findings, and need for close f/u with PCP or on call doctor. The patient has been instructed to return immediately if the symptoms worsen in any way for re-evaluation. Patient verbalized understanding and is in agreement with current care plan. All questions answered prior to discharge.          Final Clinical Impression(s) / ED Diagnoses Final diagnoses:  Hyperglycemia  Other specified diabetes mellitus without complication, without long-term current use of insulin ([place])  Viral URI with cough    Rx / DC Orders ED Discharge Orders     None         Lianne Cure, DO [id number]

## 2022-01-24 NOTE — ED Notes (Signed)
Pt in bed, sig other at bedside, offered an interpreter, states that they don't need one at this time, pt and sig other verbalized understanding d/c instructions and follow up, resps even and unlabored, pt ambulatory from dpt.

## 2022-01-24 NOTE — ED Triage Notes (Signed)
Pt states congestion, sore throat, ha, and fatigue x 5 days States blood sugar high at home of 350 C/o weakness

## 2022-05-20 ENCOUNTER — Encounter (HOSPITAL_BASED_OUTPATIENT_CLINIC_OR_DEPARTMENT_OTHER): Payer: Self-pay | Admitting: Emergency Medicine

## 2022-05-20 ENCOUNTER — Other Ambulatory Visit: Payer: Self-pay

## 2022-05-20 ENCOUNTER — Emergency Department (HOSPITAL_BASED_OUTPATIENT_CLINIC_OR_DEPARTMENT_OTHER)
Admission: EM | Admit: 2022-05-20 | Discharge: 2022-05-20 | Disposition: A | Discharge status: Home / Self Care | Payer: Medicare Other | Attending: Emergency Medicine | Admitting: Emergency Medicine

## 2022-05-20 DIAGNOSIS — L237 Allergic contact dermatitis due to plants, except food: Secondary | ICD-10-CM | POA: Insufficient documentation

## 2022-05-20 DIAGNOSIS — R21 Rash and other nonspecific skin eruption: Secondary | ICD-10-CM | POA: Diagnosis present

## 2022-05-20 DIAGNOSIS — Z79899 Other long term (current) drug therapy: Secondary | ICD-10-CM | POA: Insufficient documentation

## 2022-05-20 MED ORDER — DEXAMETHASONE SODIUM PHOSPHATE 10 MG/ML IJ SOLN
6.0000 mg | Freq: Once | INTRAMUSCULAR | Status: AC
Start: 2022-05-20 — End: 2022-05-20
  Administered 2022-05-20: 6 mg via INTRAMUSCULAR
  Filled 2022-05-20: qty 1

## 2022-05-20 NOTE — ED Provider Notes (Signed)
[facility] EMERGENCY DEPARTMENT Provider Note   CSN: [id number] Arrival date & time: 05/20/22  8341     History  Chief Complaint  Patient presents with   Rash    Tabitha Esparza is a 81 y.o. female.  Patient is a 81 year old female who presents with a rash.  She was recently working outside and feels like she came in contact with something.  She has had an itchy rash to both of her legs and her right arm.  Its been going on for the last few days.  She has not been using medications.  She denies any facial swelling.  No shortness of breath.  No history of similar reactions in the past.      Home Medications Prior to Admission medications   Medication Sig Start Date End Date Taking? Authorizing Provider  atorvastatin (LIPITOR) 40 MG tablet Take 40 mg by mouth daily. 05/08/22   [provider]  hydroxypropyl methylcellulose / hypromellose (ISOPTO TEARS / GONIOVISC) 2.5 % ophthalmic solution Place 1 drop into the right eye 3 (three) times daily as needed for dry eyes. 12/23/18   Jacalyn Lefevre, MD  hydrOXYzine (ATARAX) 25 MG tablet Take 25 mg by mouth daily. 05/14/22   [provider]  lisinopril (ZESTRIL) 20 MG tablet Take 20 mg by mouth daily. 05/06/22   [provider]  metFORMIN (GLUCOPHAGE) 500 MG tablet Take 500 mg by mouth 2 (two) times daily. 03/01/22   [provider]  RYBELSUS 7 MG TABS Take by mouth. 04/10/22   [provider]  SOLIQUA 100-33 UNT-MCG/ML SOPN SMARTSIG:15 Unit(s) SUB-Q Daily 04/21/22   [provider]  triamcinolone (KENALOG) 0.025 % cream Apply topically. 05/19/22   [provider]      Allergies    Carrot [daucus carota]    Review of Systems   Review of Systems  Constitutional:  Negative for fatigue and fever.  HENT:  Negative for facial swelling.   Gastrointestinal:  Negative for nausea and vomiting.  Skin:  Positive for rash. Negative for wound.  Neurological:  Negative for headaches.    Physical Exam Updated Vital Signs BP (!) 149/91 (BP Location: Right Arm)   Pulse 73   Temp 97.7 F (36.5 C) (Oral)   Resp 16   Ht 5\' 4"  (1.626 m)   Wt 55.3 kg   SpO2 97%   BMI 20.94 kg/m  Physical Exam Constitutional:      Appearance: She is well-developed.  HENT:     Head: Normocephalic and atraumatic.  Eyes:     Pupils: Pupils are equal, round, and reactive to light.  Cardiovascular:     Rate and Rhythm: Normal rate and regular rhythm.     Heart sounds: Normal heart sounds.  Pulmonary:     Effort: Pulmonary effort is normal. No respiratory distress.     Breath sounds: Normal breath sounds. No wheezing or rales.  Chest:     Chest wall: No tenderness.  Abdominal:     General: Bowel sounds are normal.     Palpations: Abdomen is soft.     Tenderness: There is no abdominal tenderness. There is no guarding or rebound.  Musculoskeletal:        General: Normal range of motion.     Cervical back: Normal range of motion and neck supple.  Lymphadenopathy:     Cervical: No cervical adenopathy.  Skin:    General: Skin is warm and dry.     Findings: No rash.  Comments: There is a erythematous slightly lesion rash to 2 the right lower leg.  There is a small patch on the left lower leg and a small area on her right forearm.  It is blanching.  No petechiae or purpura.  There are some small areas of vesicles.  Neurological:     Mental Status: She is alert and oriented to person, place, and time.    ED Results / Procedures / Treatments   Labs (all labs ordered are listed, but only abnormal results are displayed) Labs Reviewed - No data to display  EKG None  Radiology No results found.  Procedures Procedures    Medications Ordered in ED Medications  dexamethasone (DECADRON) injection 6 mg (has no administration in time range)    ED Course/ Medical Decision Making/ A&P                           Medical Decision Making Risk Prescription drug  management.   Patient is a 81 year old female who presents with a skin rash.  It looks consistent with poison ivy.  No signs of infection.  She was given a shot of Decadron.  She actually has a refill of some triamcinolone cream that is ready at her pharmacy.  This was prescribed by dermatologist recently for other skin condition.  She will pick this up at the pharmacy today and use it to the affected areas.  I did advise them not to use it to her face or genital area.  She was advised to follow-up with her primary care doctor if her symptoms are improving.  Return precautions were given.  Final Clinical Impression(s) / ED Diagnoses Final diagnoses:  Poison ivy dermatitis    Rx / DC Orders ED Discharge Orders     None         Rolan Bucco, MD 05/20/22 [phone]

## 2022-05-20 NOTE — ED Triage Notes (Signed)
Pt has been outside recently and has generalized itchy rash.  Large area to right lower leg.

## 2022-06-27 IMAGING — DX DG CHEST 1V PORT
1 series · 1 of 1 positions shown · non-contrast
Comparison: 06/08/2020

CLINICAL DATA: Cough, congestion, headache, sore throat, and
fatigue for 5 days.

EXAM:
PORTABLE CHEST 1 VIEW

[chest ap]
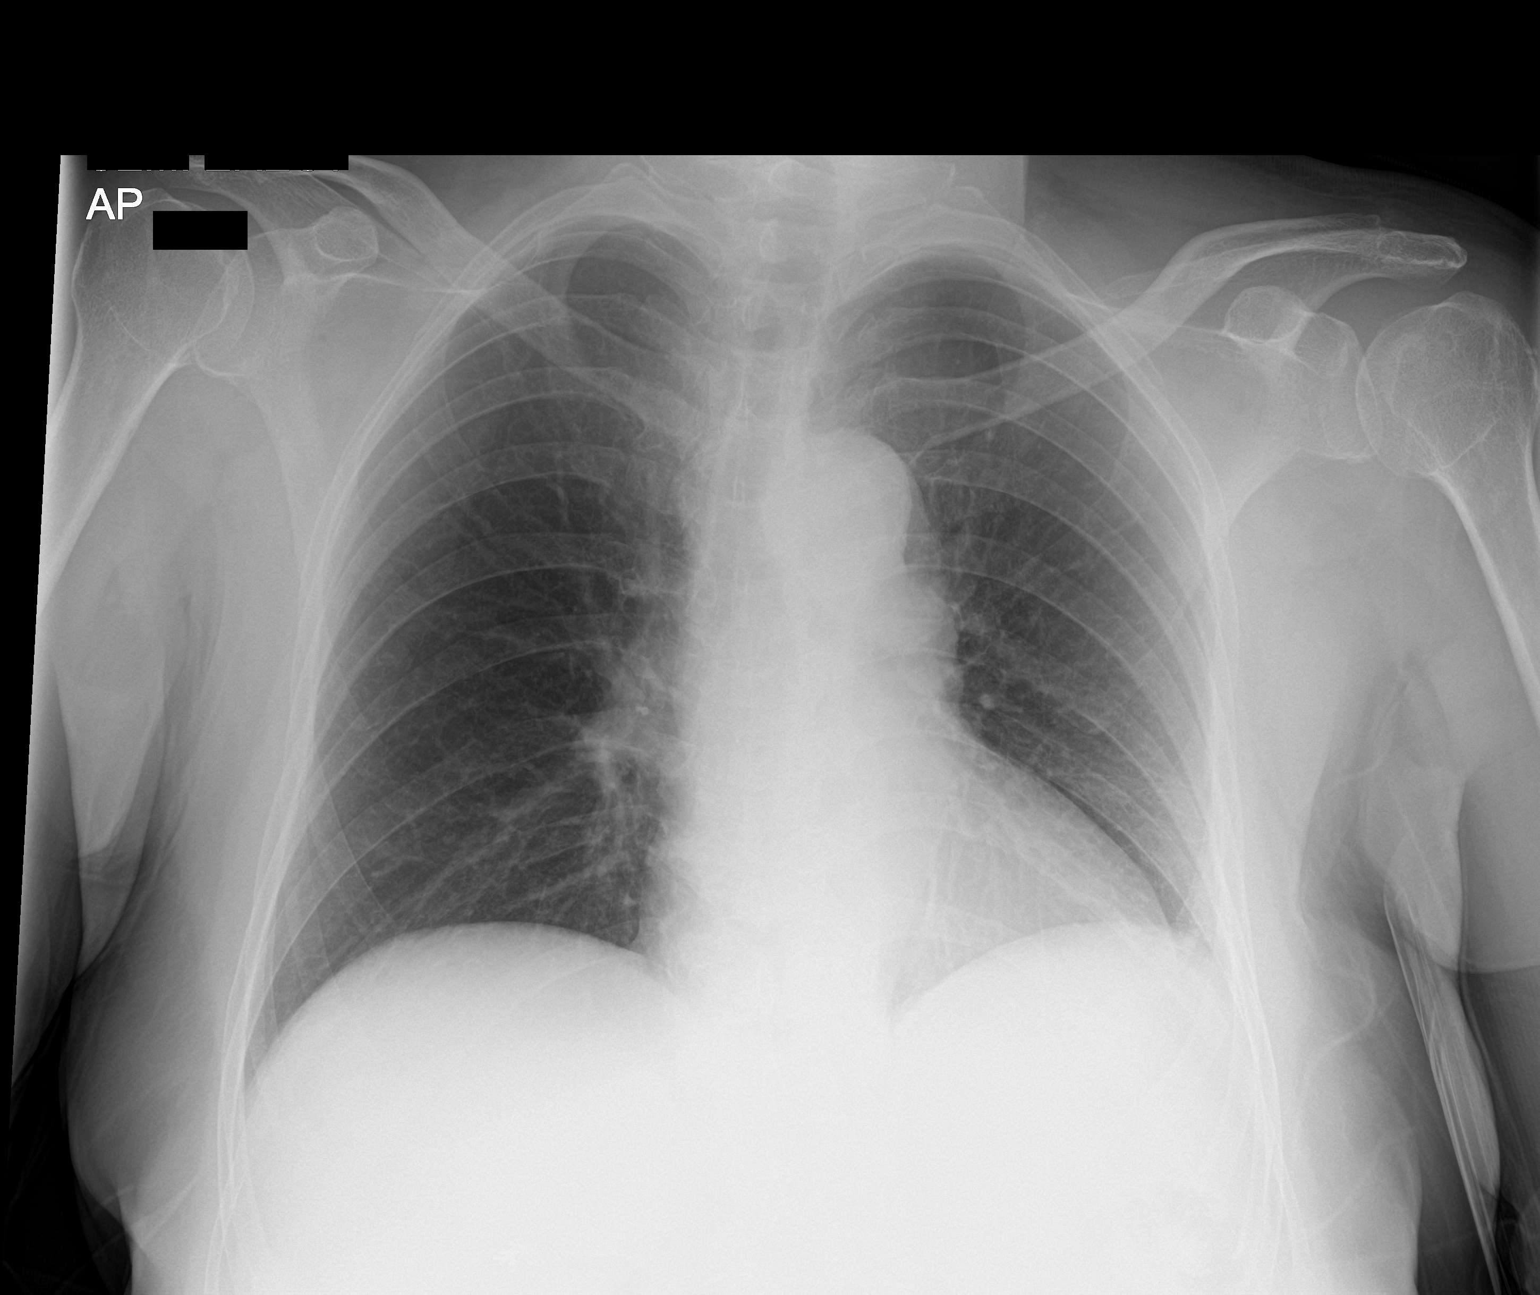

[1 of 1 positions shown; findings below may reference images not displayed]

FINDINGS: The cardiomediastinal silhouette is unchanged with normal heart
size. The lungs are hypoinflated with minimal atelectasis in the
left lung base. No segmental airspace consolidation, edema, sizable
pleural effusion, or pneumothorax is identified. Right upper
quadrant abdominal surgical clips are noted. No acute osseous
abnormality is seen.
IMPRESSION: No active disease.

## 2023-01-23 ENCOUNTER — Emergency Department (HOSPITAL_BASED_OUTPATIENT_CLINIC_OR_DEPARTMENT_OTHER): Payer: 59

## 2023-01-23 ENCOUNTER — Emergency Department (HOSPITAL_BASED_OUTPATIENT_CLINIC_OR_DEPARTMENT_OTHER)
Admission: EM | Admit: 2023-01-23 | Discharge: 2023-01-23 | Disposition: A | Discharge status: Home / Self Care | Payer: 59 | Attending: Emergency Medicine | Admitting: Emergency Medicine

## 2023-01-23 ENCOUNTER — Other Ambulatory Visit: Payer: Self-pay

## 2023-01-23 ENCOUNTER — Encounter (HOSPITAL_BASED_OUTPATIENT_CLINIC_OR_DEPARTMENT_OTHER): Payer: Self-pay

## 2023-01-23 DIAGNOSIS — E119 Type 2 diabetes mellitus without complications: Secondary | ICD-10-CM | POA: Insufficient documentation

## 2023-01-23 DIAGNOSIS — B349 Viral infection, unspecified: Secondary | ICD-10-CM | POA: Insufficient documentation

## 2023-01-23 DIAGNOSIS — Z79899 Other long term (current) drug therapy: Secondary | ICD-10-CM | POA: Insufficient documentation

## 2023-01-23 DIAGNOSIS — Z20822 Contact with and (suspected) exposure to covid-19: Secondary | ICD-10-CM | POA: Diagnosis not present

## 2023-01-23 DIAGNOSIS — I10 Essential (primary) hypertension: Secondary | ICD-10-CM | POA: Insufficient documentation

## 2023-01-23 DIAGNOSIS — R110 Nausea: Secondary | ICD-10-CM

## 2023-01-23 LAB — COMPREHENSIVE METABOLIC PANEL
ALT: 23 U/L (ref 0–44)
AST: 25 U/L (ref 15–41)
Albumin: 4.6 g/dL (ref 3.5–5.0)
Alkaline Phosphatase: 66 U/L (ref 38–126)
Anion gap: 8 (ref 5–15)
BUN: 23 mg/dL (ref 8–23)
CO2: 26 mmol/L (ref 22–32)
Calcium: 9.6 mg/dL (ref 8.9–10.3)
Chloride: 102 mmol/L (ref 98–111)
Creatinine, Ser: 1.16 mg/dL — ABNORMAL HIGH (ref 0.44–1.00)
GFR, Estimated: 48 mL/min — ABNORMAL LOW (ref >60)
Glucose, Bld: 166 mg/dL — ABNORMAL HIGH (ref 70–99)
Potassium: 3.8 mmol/L (ref 3.5–5.1)
Sodium: 136 mmol/L (ref 135–145)
Total Bilirubin: 1.2 mg/dL (ref 0.3–1.2)
Total Protein: 8.1 g/dL (ref 6.5–8.1)

## 2023-01-23 LAB — CBC WITH DIFFERENTIAL/PLATELET
Abs Immature Granulocytes: 0.02 10*3/uL (ref 0.00–0.07)
Basophils Absolute: 0 10*3/uL (ref 0.0–0.1)
Basophils Relative: 0 %
Eosinophils Absolute: 0.1 10*3/uL (ref 0.0–0.5)
Eosinophils Relative: 1 %
HCT: 42 % (ref 36.0–46.0)
Hemoglobin: 14.1 g/dL (ref 12.0–15.0)
Immature Granulocytes: 0 %
Lymphocytes Relative: 30 %
Lymphs Abs: 2.3 10*3/uL (ref 0.7–4.0)
MCH: 30.1 pg (ref 26.0–34.0)
MCHC: 33.6 g/dL (ref 30.0–36.0)
MCV: 89.6 fL (ref 80.0–100.0)
Monocytes Absolute: 0.5 10*3/uL (ref 0.1–1.0)
Monocytes Relative: 6 %
Neutro Abs: 4.7 10*3/uL (ref 1.7–7.7)
Neutrophils Relative %: 63 %
Platelets: 246 10*3/uL (ref 150–400)
RBC: 4.69 MIL/uL (ref 3.87–5.11)
RDW: 13.2 % (ref 11.5–15.5)
WBC: 7.6 10*3/uL (ref 4.0–10.5)
nRBC: 0 % (ref 0.0–0.2)

## 2023-01-23 LAB — RESP PANEL BY RT-PCR (RSV, FLU A&B, COVID)  RVPGX2
Influenza A by PCR: NEGATIVE
Influenza B by PCR: NEGATIVE
Resp Syncytial Virus by PCR: NEGATIVE
SARS Coronavirus 2 by RT PCR: NEGATIVE

## 2023-01-23 LAB — LIPASE, BLOOD: Lipase: 48 U/L (ref 11–51)

## 2023-01-23 MED ORDER — ACETAMINOPHEN 325 MG PO TABS
650.0000 mg | ORAL_TABLET | Freq: Once | ORAL | Status: AC
Start: 2023-01-23 — End: 2023-01-23
  Administered 2023-01-23: 650 mg via ORAL
  Filled 2023-01-23: qty 2

## 2023-01-23 MED ORDER — ONDANSETRON HCL 4 MG/2ML IJ SOLN
4.0000 mg | Freq: Once | INTRAMUSCULAR | Status: AC
  Administered 2023-01-23: 4 mg via INTRAVENOUS
  Filled 2023-01-23: qty 2

## 2023-01-23 MED ORDER — ONDANSETRON HCL 4 MG PO TABS: 4.0000 mg | ORAL_TABLET | Freq: Four times a day (QID) | ORAL | 0 refills | Status: AC

## 2023-01-23 NOTE — ED Provider Notes (Signed)
[place] [facility] Provider Note   CSN: [id number] Arrival date & time: 01/23/23  1144     History  Chief Complaint  Patient presents with   Nausea   Nasal Congestion   Headache    Tabitha Esparza is a 82 y.o. female.  Patient is a 82 year old female with a past medical history of hypertension, diabetes and hyperlipidemia presenting to the emergency department with abdominal pain, nausea and bodyaches for the last 2 days.  The patient states that she has associated cough and congestion and has had some mild bleeding out of her left nare.  She denies any known fevers.  She states that she has had intermittent headaches.  She states that she has had no vomiting.  She denies any diarrhea or constipation.  She denies any chest pain or shortness of breath.  She states that she has had a mild cough productive of sputum.  She denies any dysuria or hematuria or lower extremity swelling.  She reports that her grandson was sick with similar symptoms.  The history is provided by the patient and a relative. The history is limited by a language barrier. A language interpreter was used [place] [phone]).  Headache      Home Medications Prior to Admission medications   Medication Sig Start Date End Date Taking? Authorizing Provider  ondansetron (ZOFRAN) 4 MG tablet Take 1 tablet (4 mg total) by mouth every 6 (six) hours. 01/23/23  Yes Maylon Peppers, [place] K, DO  atorvastatin (LIPITOR) 40 MG tablet Take 40 mg by mouth daily. 05/08/22   [provider]  hydroxypropyl methylcellulose / hypromellose (ISOPTO TEARS / GONIOVISC) 2.5 % ophthalmic solution Place 1 drop into the right eye 3 (three) times daily as needed for dry eyes. 12/23/18   Isla Pence, MD  hydrOXYzine (ATARAX) 25 MG tablet Take 25 mg by mouth daily. 05/14/22   [provider]  lisinopril (ZESTRIL) 20 MG tablet Take 20 mg by mouth daily. 05/06/22   [provider]  metFORMIN  (GLUCOPHAGE) 500 MG tablet Take 500 mg by mouth 2 (two) times daily. 03/01/22   [provider]  RYBELSUS 7 MG TABS Take by mouth. 04/10/22   [provider]  [place]in 100-33 UNT-MCG/ML SOPN SMARTSIG:15 Unit(s) SUB-Q Daily 04/21/22   [provider]  triamcinolone (KENALOG) 0.025 % cream Apply topically. 05/19/22   [provider]      Allergies    Carrot [daucus carota]    Review of Systems   Review of Systems  Neurological:  Positive for headaches.    Physical Exam Updated Vital Signs BP (!) 161/82   Pulse 70   Temp 97.8 F (36.6 C)   Resp 13   SpO2 97%  Physical Exam Vitals and nursing note reviewed.  Constitutional:      General: She is not in acute distress.    Appearance: She is well-developed.  HENT:     Head: Normocephalic and atraumatic.     Comments: Tried blood in left nare along Kesselbach plexus    Mouth/Throat:     Mouth: Mucous membranes are moist.     Pharynx: Oropharynx is clear.     Comments: No tonsillar swelling or exudates, uvula midline, normal phonation, no trismus, tolerating secretions Eyes:     Extraocular Movements: Extraocular movements intact.     Pupils: Pupils are equal, round, and reactive to light.  Cardiovascular:     Rate and Rhythm: Normal rate and regular rhythm.  Heart sounds: Normal heart sounds.  Pulmonary:     Effort: Pulmonary effort is normal.     Breath sounds: Normal breath sounds.  Abdominal:     General: Bowel sounds are normal.     Palpations: Abdomen is soft.     Tenderness: There is abdominal tenderness (Mild epigastric tenderness to palpation without rebound or guarding).  Musculoskeletal:        General: No swelling. Normal range of motion.     Cervical back: Normal range of motion and neck supple.  Skin:    General: Skin is warm and dry.  Neurological:     Mental Status: She is alert and oriented to person, place, and time.     GCS: GCS eye subscore is 4. GCS verbal subscore is  5. GCS motor subscore is 6.     Cranial Nerves: No dysarthria or facial asymmetry.     Sensory: No sensory deficit.     Motor: No weakness.  Psychiatric:        Mood and Affect: Mood normal.        Behavior: Behavior normal.     ED Results / Procedures / Treatments   Labs (all labs ordered are listed, but only abnormal results are displayed) Labs Reviewed  COMPREHENSIVE METABOLIC PANEL - Abnormal; Notable for the following components:      Result Value   Glucose, Bld 166 (*)    Creatinine, Ser 1.16 (*)    GFR, Estimated 48 (*)    All other components within normal limits  RESP PANEL BY RT-PCR (RSV, FLU A&B, COVID)  RVPGX2  LIPASE, BLOOD  CBC WITH DIFFERENTIAL/PLATELET    EKG EKG Interpretation  Date/Time:  Tuesday January 23 2023 17:44:03 EST Ventricular Rate:  67 PR Interval:  152 QRS Duration: 89 QT Interval:  425 QTC Calculation: 449 R Axis:   92 Text Interpretation: Sinus rhythm Right axis deviation Duplicate EKG Confirmed by Leanord Asal ([id number]) on 01/23/2023 6:07:41 PM  Radiology DG Chest 2 View  Result Date: 01/23/2023 CLINICAL DATA:  Cough. EXAM: CHEST - 2 VIEW COMPARISON:  01/24/2022 FINDINGS: The cardiac silhouette, mediastinal and hilar contours are within normal limits is stable. Mild tortuosity and calcification of the thoracic aorta. No acute pulmonary findings.  No pleural effusions. Rounded density at the left lung base lateral is indeterminate finding but appears to high to be a nipple shadow. Could not exclude pulmonary nodule. Recommend follow-up chest CT further evaluation. IMPRESSION: 1. No acute pulmonary findings. 2. Possible left lower lobe pulmonary nodule. Recommend chest CT follow-up. Electronically Signed   By: Marijo Sanes M.D.   On: 01/23/2023 16:42    Procedures Procedures    Medications Ordered in ED Medications  ondansetron (ZOFRAN) injection 4 mg (4 mg Intravenous Given 01/23/23 1614)  acetaminophen (TYLENOL) tablet 650 mg (650 mg  Oral Given 01/23/23 1613)    ED Course/ Medical Decision Making/ A&P Clinical Course as of 01/23/23 1901  Tue Jan 23, 2023  1859 Patient's EKG, labs and chest x-ray are within normal range.  She reports improvement of her symptoms and is stable for discharge home with primary care follow-up.  [VK]    Clinical Course User Index [VK] Kemper Durie, DO                             Medical Decision Making This patient presents to the ED with chief complaint(s) of bodyaches, abdominal pain, nausea with pertinent  past medical history of hypertension, diabetes, hyperlipidemia which further complicates the presenting complaint. The complaint involves an extensive differential diagnosis and also carries with it a high risk of complications and morbidity.    The differential diagnosis includes viral syndrome, gastroenteritis, pneumonia, atypical ACS, pancreatitis, hepatitis, gastritis, GERD, dehydration, electrolyte abnormality DKA, HHS  Additional history obtained: Additional history obtained from family Records reviewed Care Everywhere/External Records  ED Course and Reassessment: Patient was initially evaluated in triage and had viral swab performed that was negative.  Due to patient's associated abdominal pain and cough, concerning for possible alternative diagnoses and will have EKG, labs and chest x-ray performed.  She was given Zofran for nausea and Tylenol for her headache and bodyaches will be closely reassessed.  Independent labs interpretation:  The following labs were independently interpreted: within normal range  Independent visualization of imaging: - I independently visualized the following imaging with scope of interpretation limited to determining acute life threatening conditions related to emergency care: CXR, which revealed no acute disease  Consultation: - Consulted or discussed management/test interpretation w/ external professional: N/A  Consideration for  admission or further workup: Patient has no emergent conditions requiring admission or further work-up at this time and is stable for discharge home with primary care follow-up  Social Determinants of health: N/A    Amount and/or Complexity of Data Reviewed Labs: ordered. Radiology: ordered.  Risk OTC drugs. Prescription drug management.          Final Clinical Impression(s) / ED Diagnoses Final diagnoses:  Viral syndrome  Nausea    Rx / DC Orders ED Discharge Orders          Ordered    ondansetron (ZOFRAN) 4 MG tablet  Every 6 hours        01/23/23 1900              Kemper Durie, DO 01/23/23 1901

## 2023-01-23 NOTE — ED Triage Notes (Signed)
Per interpreter [phone] Pt c/o N/V, sore throat, congestion, HA onset yesterday. States grandson was sick contact, unknown dx

## 2023-01-23 NOTE — Discharge Instructions (Signed)
You were seen in the emergency department for your nausea, cough and bodyaches.  Your workup showed no signs of pneumonia and you tested negative for COVID and flu.  You likely have another viral infection causing your symptoms and you can continue to take Zofran as needed for nausea and Tylenol for headaches and bodyaches.  He should follow-up with your primary doctor in the next few days to have your symptoms rechecked.  You should return to the emergency department for repetitive vomiting despite the nausea medicine, fevers, severe abdominal pain, shortness of breath or if you have any other new or concerning symptoms.

## 2023-05-29 ENCOUNTER — Other Ambulatory Visit: Payer: Self-pay

## 2023-05-29 ENCOUNTER — Encounter (HOSPITAL_BASED_OUTPATIENT_CLINIC_OR_DEPARTMENT_OTHER): Payer: Self-pay | Admitting: Urology

## 2023-05-29 ENCOUNTER — Emergency Department (HOSPITAL_BASED_OUTPATIENT_CLINIC_OR_DEPARTMENT_OTHER)
Admission: EM | Admit: 2023-05-29 | Discharge: 2023-05-29 | Disposition: A | Discharge status: Home / Self Care | Payer: 59 | Attending: Emergency Medicine | Admitting: Emergency Medicine

## 2023-05-29 DIAGNOSIS — E119 Type 2 diabetes mellitus without complications: Secondary | ICD-10-CM | POA: Insufficient documentation

## 2023-05-29 DIAGNOSIS — Z79899 Other long term (current) drug therapy: Secondary | ICD-10-CM | POA: Insufficient documentation

## 2023-05-29 DIAGNOSIS — L237 Allergic contact dermatitis due to plants, except food: Secondary | ICD-10-CM | POA: Diagnosis present

## 2023-05-29 DIAGNOSIS — Z7984 Long term (current) use of oral hypoglycemic drugs: Secondary | ICD-10-CM | POA: Diagnosis not present

## 2023-05-29 DIAGNOSIS — I10 Essential (primary) hypertension: Secondary | ICD-10-CM | POA: Diagnosis not present

## 2023-05-29 MED ORDER — DEXAMETHASONE SODIUM PHOSPHATE 10 MG/ML IJ SOLN
12.0000 mg | Freq: Once | INTRAMUSCULAR | Status: AC
  Administered 2023-05-29: 12 mg via INTRAMUSCULAR
  Filled 2023-05-29: qty 2

## 2023-05-29 NOTE — Discharge Instructions (Addendum)
Hm nay b?n ? b? pht hi?n v phn nn v? cy th??ng xun ??c. Thu?c xu?t vi?n c?a b?n bao g?m Tecnu. ?y l lo?i x phng khng c?n k ??n dng ?? tr? b?nh th??ng xun ??c. S? d?ng hng ngy ny ?? gip ?? cc tri?u ch?ng c?a b?n. Kem d??ng da Calamine. ?y l lo?i kem d??ng da gip gi?m ng?a v b?ng rt. p d?ng cho khu v?c b? ?nh h??ng hai l?n m?i ngy. Benadryl. ?y l m?t lo?i thu?c u?ng ???c s? d?ng ?? gip gi?m cc tri?u ch?ng c?a b?n. Lin h? v?i: Nh cung c?p d?ch v? ch?m [place] chnh c?a b?n sau 5 ngy ?? ?nh gi l?i. Vui lng tm ki?m s? ch?m [place] y t? ngay l?p t?c n?u b?n pht tri?n b?t k? tri?u ch?ng no sau ?y:  M?t c?a s?n ph?m qu gi ho?c m?t c?a s?n ph?m qu gi.  V? tr b? ch?m d?t.  Qu v? b? kh kh?n. T?i th?i ?i?m ny, d??ng nh? khng c tnh tr?ng b?nh l c?p c?u no xu?t hi?n, tuy nhin lun c kh? n?ng tnh tr?ng b?nh s? thay ??i. Vui lng ??c v lm theo h??ng d?n bn d??i.  Khng dng thu?c n?u b? pht ban ng?a, s?ng mi?ng ho?c mi ho?c kh th?; hy g?i 911 v tm ki?m s? ch?m [place] y t? kh?n c?p ngay l?p t?c n?u ?i?u ny x?y ra.  B?n c th? xem l?i ton b? cc xt nghi?m trong phng th nghi?m v k?t qu? hnh ?nh c?a mnh trn ti kho?n MyChart c?a mnh.  Vui lng th?o lu?n ??y ?? v? t?t c? cc k?t qu? v?i nh cung c?p d?ch v? ch?m [place] chnh c?a b?n v chuyn gia khc trong l?n ti khm c?a b?n.  L?u : Cc ph?n c?a v?n b?n ny c th? ? ???c sao chp b?ng ph?n m?m nh?n d?ng gi?ng ni. M?i n? l?c ? ???c th?c hi?n ?? ??m b?o tnh chnh xc; tuy nhin, v?n c th? t?n t?i nh?ng l?i sao chp my tnh v tnh.  You have been seen today for your complaint of poison ivy. Your discharge medications include Tecnu. This is an over the counter soap used to help with poison ivy. Use this daily to help with your symptoms. Calamine lotion. This is a lotion to help with your burning and itching. Apply to the affected area twice per day. Benadryl. This is an oral medication used to help  with your symptoms. Follow up with: Your primary care provider in 5 days for reevaluation. Please seek immediate medical care if you develop any of the following symptoms: M?t c?a qu v? s?ng ln ho?c m?t c?a qu v? b? s?ng hp. Qu v? b? kh th?. Qu v? b? kh nu?t. At this time there does not appear to be the presence of an emergent medical condition, however there is always the potential for conditions to change. Please read and follow the below instructions.  Do not take your medicine if  develop an itchy rash, swelling in your mouth or lips, or difficulty breathing; call 911 and seek immediate emergency medical attention if this occurs.  You may review your lab tests and imaging results in their entirety on your MyChart account.  Please discuss all results of fully with your primary care provider and other specialist at your follow-up visit.  Note: Portions of this text may have been transcribed using voice recognition software. Every effort was made to ensure accuracy; however, inadvertent  computerized transcription errors may still be present.

## 2023-05-29 NOTE — ED Provider Notes (Signed)
[facility] [facility] Provider Note   CSN: [id number] Arrival date & time: 05/29/23  1307     History  Chief Complaint  Patient presents with   Poison Ivy    Tabitha Esparza is a 82 y.o. female.  With a history of hypertension, diabetes, hypercholesterolemia who presents to the ED for evaluation of contact dermatitis.  She states she was in the yard cutting weeds yesterday afternoon when she came into contact with some poison ivy.  States this hit her in the right side of her face.  She has had similar reactions in the past.  Shortly after her contact she developed some itching and burning on the right side of her face as well as some swelling.  She has not taken any medications at home for her symptoms.    History is provided by patient with use of a vietnamese interpreter.  Poison Ivy       Home Medications Prior to Admission medications   Medication Sig Start Date End Date Taking? Authorizing Provider  atorvastatin (LIPITOR) 40 MG tablet Take 40 mg by mouth daily. 05/08/22   [provider]  hydroxypropyl methylcellulose / hypromellose (ISOPTO TEARS / GONIOVISC) 2.5 % ophthalmic solution Place 1 drop into the right eye 3 (three) times daily as needed for dry eyes. 12/23/18   Jacalyn Lefevre, MD  hydrOXYzine (ATARAX) 25 MG tablet Take 25 mg by mouth daily. 05/14/22   [provider]  lisinopril (ZESTRIL) 20 MG tablet Take 20 mg by mouth daily. 05/06/22   [provider]  metFORMIN (GLUCOPHAGE) 500 MG tablet Take 500 mg by mouth 2 (two) times daily. 03/01/22   [provider]  ondansetron (ZOFRAN) 4 MG tablet Take 1 tablet (4 mg total) by mouth every 6 (six) hours. 01/23/23   Rexford Maus, DO  RYBELSUS 7 MG TABS Take by mouth. 04/10/22   [provider]  SOLIQUA 100-33 UNT-MCG/ML SOPN SMARTSIG:15 Unit(s) SUB-Q Daily 04/21/22   [provider]  triamcinolone (KENALOG) 0.025 % cream Apply topically.  05/19/22   [provider]      Allergies    Carrot [daucus carota] and Poison ivy extract    Review of Systems   Review of Systems  Skin:  Positive for rash.  All other systems reviewed and are negative.   Physical Exam Updated Vital Signs BP 125/89 (BP Location: Left Arm)   Pulse 93   Temp 98.7 F (37.1 C)   Resp 18   Ht 5\' 4"  (1.626 m)   Wt 55.3 kg   SpO2 96%   BMI 20.93 kg/m  Physical Exam Vitals and nursing note reviewed.  Constitutional:      General: She is not in acute distress.    Appearance: She is well-developed.     Comments: Resting comfortably in chair.  No acute distress.  HENT:     Head: Normocephalic and atraumatic.     Mouth/Throat:     Mouth: Mucous membranes are moist.     Pharynx: Oropharynx is clear.     Comments: Uvula midline.  No intraoral swelling.  Airway widely patent Eyes:     Conjunctiva/sclera: Conjunctivae normal.  Cardiovascular:     Rate and Rhythm: Normal rate and regular rhythm.     Heart sounds: No murmur heard. Pulmonary:     Effort: Pulmonary effort is normal. No respiratory distress.     Breath sounds: Normal breath sounds. No stridor. No wheezing, rhonchi or rales.  Abdominal:     Palpations: Abdomen is soft.     Tenderness: There is no abdominal tenderness. There is no guarding.  Musculoskeletal:        General: No swelling.     Cervical back: Neck supple.  Skin:    General: Skin is warm and dry.     Capillary Refill: Capillary refill takes less Formby 2 seconds.     Comments: Small amount of swelling to the right side of face.  Small amount of erythema just laterally to the right side of the mouth with few vesicles.  No extension to the intraoral cavity  Neurological:     General: No focal deficit present.     Mental Status: She is alert and oriented to person, place, and time.  Psychiatric:        Mood and Affect: Mood normal.     ED Results / Procedures / Treatments   Labs (all labs ordered are listed,  but only abnormal results are displayed) Labs Reviewed - No data to display  EKG None  Radiology No results found.  Procedures Procedures    Medications Ordered in ED Medications  dexamethasone (DECADRON) injection 12 mg (12 mg Intramuscular Given 05/29/23 1418)    ED Course/ Medical Decision Making/ A&P                             Medical Decision Making Risk Prescription drug management.  This patient presents to the ED for concern of contact dermatitis, this involves an extensive number of treatment options, and is a complaint that carries with it a high risk of complications and morbidity.  The differential diagnosis includes posion oak, ivy or sumac, cellulitis, anaphylaxis  Co morbidities that complicate the patient evaluation  hypertension, diabetes, hypercholesterolemia  My initial workup includes decadron injection  Additional history obtained from: Nursing notes from this visit. Family provides a portion of the history  Afebrile, hemodynamically stable.  82 year old female presenting to the ED for evaluation of poison ivy exposure.  Physical exam is consistent with poison ivy exposure as well.  She does have some swelling, erythema and few vesicular lesions to the right side of the face.  She was complaining of some difficulty swallowing, however she has handling oral secretions very well.  She is in no acute distress and appears very well on physical exam.  Very low suspicion for anaphylaxis at this time given her exposure was approximately 24 hours ago.  Treated patient with an injection of Decadron in the ED. Given her age and the fact that her rashes on her face, will hold off on topical steroids at this time.  She was encouraged to use over-the-counter medications such as Benadryl, calamine lotion, and Tecnu for her symptoms.  She was encouraged to follow-up with her PCP in 1 week for reevaluation.  She was given strict return precautions including signs and  symptoms of anaphylaxis.  Stable at discharge.  At this time there does not appear to be any evidence of an acute emergency medical condition and the patient appears stable for discharge with appropriate outpatient follow up. Diagnosis was discussed with patient who verbalizes understanding of care plan and is agreeable to discharge. I have discussed return precautions with patient and grandson who verbalizes understanding. Patient encouraged to follow-up with their PCP within 1 week. All questions answered.  Patient's case discussed with Dr. Jacqulyn Bath who agrees with plan to discharge with follow-up.  Note: Portions of this report may have been transcribed using voice recognition software. Every effort was made to ensure accuracy; however, inadvertent computerized transcription errors may still be present.        Final Clinical Impression(s) / ED Diagnoses Final diagnoses:  Poison ivy dermatitis    Rx / DC Orders ED Discharge Orders     None         Michelle Piper, Cordelia Poche 05/29/23 1432    Long, Arlyss Repress, MD 05/30/23 1221

## 2023-05-29 NOTE — ED Triage Notes (Signed)
Non-english speaking,fmaly at bedside  Per family pt touched poison ivy on face while cutting weeds, rash noted to right side of mouth and swelling noted , unable to take medication due to pain in face

## 2024-06-08 ENCOUNTER — Emergency Department (HOSPITAL_BASED_OUTPATIENT_CLINIC_OR_DEPARTMENT_OTHER): Admission: EM | Admit: 2024-06-08 | Discharge: 2024-06-08 | Disposition: A | Discharge status: Home / Self Care

## 2024-06-08 ENCOUNTER — Emergency Department (HOSPITAL_BASED_OUTPATIENT_CLINIC_OR_DEPARTMENT_OTHER)

## 2024-06-08 ENCOUNTER — Encounter (HOSPITAL_BASED_OUTPATIENT_CLINIC_OR_DEPARTMENT_OTHER): Payer: Self-pay | Admitting: Emergency Medicine

## 2024-06-08 DIAGNOSIS — R059 Cough, unspecified: Secondary | ICD-10-CM | POA: Insufficient documentation

## 2024-06-08 DIAGNOSIS — R42 Dizziness and giddiness: Secondary | ICD-10-CM | POA: Diagnosis not present

## 2024-06-08 DIAGNOSIS — R519 Headache, unspecified: Secondary | ICD-10-CM | POA: Insufficient documentation

## 2024-06-08 DIAGNOSIS — R0602 Shortness of breath: Secondary | ICD-10-CM | POA: Insufficient documentation

## 2024-06-08 DIAGNOSIS — E119 Type 2 diabetes mellitus without complications: Secondary | ICD-10-CM | POA: Diagnosis not present

## 2024-06-08 DIAGNOSIS — R1084 Generalized abdominal pain: Secondary | ICD-10-CM | POA: Insufficient documentation

## 2024-06-08 DIAGNOSIS — R112 Nausea with vomiting, unspecified: Secondary | ICD-10-CM | POA: Diagnosis present

## 2024-06-08 DIAGNOSIS — Z79899 Other long term (current) drug therapy: Secondary | ICD-10-CM | POA: Insufficient documentation

## 2024-06-08 DIAGNOSIS — I10 Essential (primary) hypertension: Secondary | ICD-10-CM | POA: Diagnosis not present

## 2024-06-08 DIAGNOSIS — Z7984 Long term (current) use of oral hypoglycemic drugs: Secondary | ICD-10-CM | POA: Diagnosis not present

## 2024-06-08 LAB — CBC WITH DIFFERENTIAL/PLATELET
Abs Immature Granulocytes: 0.01 10*3/uL (ref 0.00–0.07)
Basophils Absolute: 0.1 10*3/uL (ref 0.0–0.1)
Basophils Relative: 1 %
Eosinophils Absolute: 0.2 10*3/uL (ref 0.0–0.5)
Eosinophils Relative: 3 %
HCT: 39.5 % (ref 36.0–46.0)
Hemoglobin: 13.5 g/dL (ref 12.0–15.0)
Immature Granulocytes: 0 %
Lymphocytes Relative: 30 %
Lymphs Abs: 2.2 10*3/uL (ref 0.7–4.0)
MCH: 30.4 pg (ref 26.0–34.0)
MCHC: 34.2 g/dL (ref 30.0–36.0)
MCV: 89 fL (ref 80.0–100.0)
Monocytes Absolute: 0.7 10*3/uL (ref 0.1–1.0)
Monocytes Relative: 9 %
Neutro Abs: 4.2 10*3/uL (ref 1.7–7.7)
Neutrophils Relative %: 57 %
Platelets: 228 10*3/uL (ref 150–400)
RBC: 4.44 MIL/uL (ref 3.87–5.11)
RDW: 12.7 % (ref 11.5–15.5)
WBC: 7.3 10*3/uL (ref 4.0–10.5)
nRBC: 0 % (ref 0.0–0.2)

## 2024-06-08 LAB — URINALYSIS, ROUTINE W REFLEX MICROSCOPIC
Bilirubin Urine: NEGATIVE
Glucose, UA: >=500 mg/dL — AB
Hgb urine dipstick: NEGATIVE
Ketones, ur: NEGATIVE mg/dL
Leukocytes,Ua: NEGATIVE
Nitrite: NEGATIVE
Protein, ur: NEGATIVE mg/dL
Specific Gravity, Urine: 1.01 (ref 1.005–1.030)
pH: 5.5 (ref 5.0–8.0)

## 2024-06-08 LAB — URINALYSIS, MICROSCOPIC (REFLEX)
Squamous Epithelial / HPF: NONE SEEN /HPF (ref 0–5)
WBC, UA: NONE SEEN WBC/hpf (ref 0–5)

## 2024-06-08 LAB — COMPREHENSIVE METABOLIC PANEL WITH GFR
ALT: 47 U/L — ABNORMAL HIGH (ref 0–44)
AST: 39 U/L (ref 15–41)
Albumin: 4.2 g/dL (ref 3.5–5.0)
Alkaline Phosphatase: 86 U/L (ref 38–126)
Anion gap: 12 (ref 5–15)
BUN: 21 mg/dL (ref 8–23)
CO2: 22 mmol/L (ref 22–32)
Calcium: 9.3 mg/dL (ref 8.9–10.3)
Chloride: 105 mmol/L (ref 98–111)
Creatinine, Ser: 1.06 mg/dL — ABNORMAL HIGH (ref 0.44–1.00)
GFR, Estimated: 53 mL/min — ABNORMAL LOW (ref >60)
Glucose, Bld: 185 mg/dL — ABNORMAL HIGH (ref 70–99)
Potassium: 4.1 mmol/L (ref 3.5–5.1)
Sodium: 140 mmol/L (ref 135–145)
Total Bilirubin: 0.5 mg/dL (ref 0.0–1.2)
Total Protein: 6.7 g/dL (ref 6.5–8.1)

## 2024-06-08 LAB — TROPONIN T, HIGH SENSITIVITY
Troponin T High Sensitivity: <15 ng/L (ref <19)
Troponin T High Sensitivity: <15 ng/L (ref <19)

## 2024-06-08 LAB — RESP PANEL BY RT-PCR (RSV, FLU A&B, COVID)  RVPGX2
Influenza A by PCR: NEGATIVE
Influenza B by PCR: NEGATIVE
Resp Syncytial Virus by PCR: NEGATIVE
SARS Coronavirus 2 by RT PCR: NEGATIVE

## 2024-06-08 LAB — LIPASE, BLOOD: Lipase: 70 U/L — ABNORMAL HIGH (ref 11–51)

## 2024-06-08 MED ORDER — ACETAMINOPHEN 10 MG/ML IV SOLN: 1000.0000 mg | Freq: Four times a day (QID) | INTRAVENOUS | Status: DC

## 2024-06-08 MED ORDER — MECLIZINE HCL 12.5 MG PO TABS: 12.5000 mg | ORAL_TABLET | Freq: Three times a day (TID) | ORAL | 0 refills | Status: AC | PRN

## 2024-06-08 MED ORDER — MECLIZINE HCL 25 MG PO TABS
12.5000 mg | ORAL_TABLET | Freq: Once | ORAL | Status: AC
  Administered 2024-06-08: 12.5 mg via ORAL
  Filled 2024-06-08: qty 1

## 2024-06-08 MED ORDER — IOHEXOL 300 MG/ML  SOLN
75.0000 mL | Freq: Once | INTRAMUSCULAR | Status: AC | PRN
  Administered 2024-06-08: 75 mL via INTRAVENOUS

## 2024-06-08 MED ORDER — ONDANSETRON 4 MG PO TBDP
4.0000 mg | ORAL_TABLET | Freq: Three times a day (TID) | ORAL | 0 refills | Status: AC | PRN
Start: 2024-06-08 — End: ?

## 2024-06-08 MED ORDER — PROCHLORPERAZINE EDISYLATE 10 MG/2ML IJ SOLN
5.0000 mg | Freq: Once | INTRAMUSCULAR | Status: AC
  Administered 2024-06-08: 5 mg via INTRAVENOUS
  Filled 2024-06-08: qty 2

## 2024-06-08 MED ORDER — LACTATED RINGERS IV BOLUS
1000.0000 mL | Freq: Once | INTRAVENOUS | Status: AC
  Administered 2024-06-08: 1000 mL via INTRAVENOUS

## 2024-06-08 MED ORDER — ONDANSETRON HCL 4 MG/2ML IJ SOLN
4.0000 mg | Freq: Once | INTRAMUSCULAR | Status: DC
  Filled 2024-06-08: qty 2

## 2024-06-08 NOTE — ED Provider Notes (Signed)
 [place] [facility] Provider Note   CSN: [id number] Arrival date & time: 06/08/24  1733     Patient presents with: Dizziness and Emesis   Tabitha Esparza is a 83 y.o. female.   83 year old female with past medical history of diabetes, hypertension, and hyperlipidemia presenting to the emergency department today with multiple complaints.  The patient came in primarily for headache and dizziness.  It is difficult to determine even with the interpreter if she is having more lightheadedness or room spinning dizziness.  She states that she is also having some shortness of breath.  She is also complaining of abdominal pain.  Denies any associated diarrhea but has had 2 episodes of nonbloody, nonbilious emesis earlier today.  She also reports having a cough that is minimally productive.  Denies any hemoptysis.  She came to the ER today for further evaluation regarding this.   Dizziness Associated symptoms: nausea and vomiting   Emesis Associated symptoms: abdominal pain and cough        Prior to Admission medications   Medication Sig Start Date End Date Taking? Authorizing Provider  meclizine (ANTIVERT) 12.5 MG tablet Take 1 tablet (12.5 mg total) by mouth 3 (three) times daily as needed for dizziness. 06/08/24  Yes Ula Prentice SAUNDERS, MD  ondansetron  (ZOFRAN -ODT) 4 MG disintegrating tablet Take 1 tablet (4 mg total) by mouth every 8 (eight) hours as needed for nausea or vomiting. 06/08/24  Yes Ula Prentice SAUNDERS, MD  atorvastatin (LIPITOR) 40 MG tablet Take 40 mg by mouth daily. 05/08/22   [provider]  hydroxypropyl methylcellulose / hypromellose (ISOPTO TEARS / GONIOVISC) 2.5 % ophthalmic solution Place 1 drop into the right eye 3 (three) times daily as needed for dry eyes. 12/23/18   Haviland, Julie, MD  hydrOXYzine (ATARAX) 25 MG tablet Take 25 mg by mouth daily. 05/14/22   [provider]  lisinopril (ZESTRIL) 20 MG tablet Take 20 mg by mouth daily.  05/06/22   [provider]  metFORMIN (GLUCOPHAGE) 500 MG tablet Take 500 mg by mouth 2 (two) times daily. 03/01/22   [provider]  ondansetron  (ZOFRAN ) 4 MG tablet Take 1 tablet (4 mg total) by mouth every 6 (six) hours. 01/23/23   Kingsley, Victoria K, DO  RYBELSUS 7 MG TABS Take by mouth. 04/10/22   [provider]  SOLIQUA 100-33 UNT-MCG/ML SOPN SMARTSIG:15 Unit(s) SUB-Q Daily 04/21/22   [provider]  triamcinolone (KENALOG) 0.025 % cream Apply topically. 05/19/22   [provider]    Allergies: Carrot [daucus carota] and Poison ivy extract    Review of Systems  Respiratory:  Positive for cough.   Gastrointestinal:  Positive for abdominal pain, nausea and vomiting.  Neurological:  Positive for dizziness.  All other systems reviewed and are negative.   Updated Vital Signs BP (!) 143/83   Pulse 65   Temp (!) 97 F (36.1 C)   Resp 18   Ht 5' 3 (1.6 m)   Wt 54.4 kg   SpO2 97%   BMI 21.26 kg/m   Physical Exam Vitals and nursing note reviewed.   Gen: NAD Eyes: PERRL, EOMI HEENT: no oropharyngeal swelling Neck: trachea midline Resp: clear to auscultation bilaterally Card: RRR, no murmurs, rubs, or gallops Abd: Abdomen diffusely tender with no guarding or rebound Extremities: no calf tenderness, no edema Vascular: 2+ radial pulses bilaterally, 2+ DP pulses bilaterally Neuro: Cranial nerves intact, equal strength and sensation throughout bilateral upper and lower extremities  with no dysmetria on finger-to-nose testing Skin: no rashes Psyc: acting appropriately   (all labs ordered are listed, but only abnormal results are displayed) Labs Reviewed  COMPREHENSIVE METABOLIC PANEL WITH GFR - Abnormal; Notable for the following components:      Result Value   Glucose, Bld 185 (*)    Creatinine, Ser 1.06 (*)    ALT 47 (*)    GFR, Estimated 53 (*)    All other components within normal limits  LIPASE, BLOOD - Abnormal; Notable for  the following components:   Lipase 70 (*)    All other components within normal limits  URINALYSIS, ROUTINE W REFLEX MICROSCOPIC - Abnormal; Notable for the following components:   Glucose, UA >=500 (*)    All other components within normal limits  URINALYSIS, MICROSCOPIC (REFLEX) - Abnormal; Notable for the following components:   Bacteria, UA RARE (*)    All other components within normal limits  RESP PANEL BY RT-PCR (RSV, FLU A&B, COVID)  RVPGX2  CBC WITH DIFFERENTIAL/PLATELET  TROPONIN T, HIGH SENSITIVITY  TROPONIN T, HIGH SENSITIVITY    EKG: EKG Interpretation Date/Time:  Sunday June 08 2024 17:58:05 EDT Ventricular Rate:  69 PR Interval:  157 QRS Duration:  89 QT Interval:  404 QTC Calculation: 433 R Axis:   77  Text Interpretation: Sinus rhythm Confirmed by Ula Barter [phone]) on 06/08/2024 6:20:54 PM  Radiology: DG Chest Portable 1 View Result Date: 06/08/2024 CLINICAL DATA:  Cough, shortness of breath EXAM: PORTABLE CHEST 1 VIEW COMPARISON:  02/24/2023 FINDINGS: Heart and mediastinal contours are within normal limits. No focal opacities or effusions. No acute bony abnormality. IMPRESSION: No active disease. Electronically Signed   By: Franky Crease M.D.   On: 06/08/2024 21:37   CT Head Wo Contrast Result Date: 06/08/2024 CLINICAL DATA:  Headache, increasing frequency or severity. Dizziness starting this morning with vomiting and epigastric pain. EXAM: CT HEAD WITHOUT CONTRAST TECHNIQUE: Contiguous axial images were obtained from the base of the skull through the vertex without intravenous contrast. RADIATION DOSE REDUCTION: This exam was performed according to the departmental dose-optimization program which includes automated exposure control, adjustment of the mA and/or kV according to patient size and/or use of iterative reconstruction technique. COMPARISON:  CT head 02/24/2023. FINDINGS: Brain: No acute intracranial hemorrhage. No CT evidence of acute infarct. Nonspecific  hypoattenuation in the periventricular and subcortical white matter favored to reflect chronic microvascular ischemic changes. No edema, mass effect, or midline shift. The basilar cisterns are patent. Ventricles: The ventricles are normal. Vascular: Atherosclerotic calcifications of the carotid siphons and intracranial vertebral arteries. No hyperdense vessel. Skull: No acute or aggressive finding. Chronic deformity of the left lamina papyracea. Orbits: Left lens replacement.  Orbits otherwise unremarkable. Sinuses: Mucosal thickening in the ethmoid sinuses. Other: Mastoid air cells are clear. IMPRESSION: No CT evidence of acute intracranial abnormality. Chronic microvascular ischemic changes and mild parenchymal volume loss. Electronically Signed   By: Donnice Mania M.D.   On: 06/08/2024 19:54   CT ABDOMEN PELVIS W CONTRAST Result Date: 06/08/2024 CLINICAL DATA:  Acute abdominal pain with nausea and vomiting EXAM: CT ABDOMEN AND PELVIS WITH CONTRAST TECHNIQUE: Multidetector CT imaging of the abdomen and pelvis was performed using the standard protocol following bolus administration of intravenous contrast. RADIATION DOSE REDUCTION: This exam was performed according to the departmental dose-optimization program which includes automated exposure control, adjustment of the mA and/or kV according to patient size and/or use of iterative reconstruction technique. CONTRAST:  75mL OMNIPAQUE IOHEXOL 300  MG/ML  SOLN COMPARISON:  02/24/2023 FINDINGS: Lower chest: No acute abnormality. Hepatobiliary: Fatty infiltration of the liver is noted. The gallbladder has been surgically removed. Pancreas: Unremarkable. No pancreatic ductal dilatation or surrounding inflammatory changes. Spleen: Normal in size without focal abnormality. Adrenals/Urinary Tract: Adrenal glands are within normal limits. Kidneys are well visualized bilaterally without renal calculi or obstructive changes. The ureters are within normal limits bilaterally.  No obstructive changes are seen. The bladder is partially distended. Stomach/Bowel: Appendix is within normal limits. No obstructive or inflammatory changes of colon are seen. Small bowel and stomach are within normal limits. Vascular/Lymphatic: Atherosclerotic calcifications of the aorta are noted. Mild fusiform dilatation of the infrarenal aorta is noted although no aneurysmal dilatation is seen. No lymphadenopathy is noted. Reproductive: Uterus and bilateral adnexa are unremarkable. Other: No abdominal wall hernia or abnormality. No abdominopelvic ascites. Musculoskeletal: Degenerative changes of lumbar spine are noted. IMPRESSION: No acute abnormality is identified. Electronically Signed   By: Oneil Devonshire M.D.   On: 06/08/2024 19:51     Procedures   Medications Ordered in the ED  meclizine (ANTIVERT) tablet 12.5 mg (12.5 mg Oral Given 06/08/24 1833)  lactated ringers bolus 1,000 mL (1,000 mLs Intravenous New Bag/Given 06/08/24 1832)  prochlorperazine (COMPAZINE) injection 5 mg (5 mg Intravenous Given 06/08/24 1833)  iohexol (OMNIPAQUE) 300 MG/ML solution 75 mL (75 mLs Intravenous Contrast Given 06/08/24 1934)                                    Medical Decision Making 83 year old female with past medical history of diabetes, hypertension, and hyperlipidemia presenting to the emergency department today with multiple complaints.  I will further evaluate the patient here with basic labs including LFTs and a lipase to evaluate for electrolyte abnormalities, anemia, hepatobiliary pathology, or pancreatitis.  Also obtain a urinalysis about for urinary tract infection.  Will obtain CT scan of her head to eval for intracranial hemorrhage or mass lesion as well as CT scan of her abdomen to evaluate for appendicitis, diverticulitis, colitis, or other intra-abdominal pathology.  Will also obtain a COVID and flu swab on the patient to eval for viral etiologies.  I will give patient IV fluids as well as  Compazine for her headache and nausea.  Will give her meclizine for the dizziness and reevaluate for ultimate disposition.  The patient's workup here is reassuring.  CT scans are unremarkable and labs are reassuring.  She is feeling better on reassessment.  She will be discharged with return precautions.  She is tolerating p.o. and is no longer having any dizziness.  Amount and/or Complexity of Data Reviewed Labs: ordered. Radiology: ordered.  Risk Prescription drug management.        Final diagnoses:  Dizziness  Nausea and vomiting, unspecified vomiting type    ED Discharge Orders          Ordered    meclizine (ANTIVERT) 12.5 MG tablet  3 times daily PRN        06/08/24 2231    ondansetron  (ZOFRAN -ODT) 4 MG disintegrating tablet  Every 8 hours PRN        06/08/24 2231               Ula Prentice SAUNDERS, MD 06/08/24 2232

## 2024-06-08 NOTE — ED Triage Notes (Signed)
 Pt c/o dizziness since around 0900; started vomiting around 1000, vomited x 2; c/o epigastric pain

## 2024-06-08 NOTE — ED Notes (Signed)
 Pt provided discharge instructions and prescription information. Pt was given the opportunity to ask questions and questions were answered.

## 2024-06-08 NOTE — Discharge Instructions (Signed)
 Your workup today was reassuring.  Please take the medications as needed for nausea and dizziness.  Drink plenty of fluids and follow-up with your doctor.  Return to the ER for worsening symptoms.
